# Patient Record
Sex: Female | Born: 1990 | Race: White | Hispanic: No | Marital: Single | Smoking: Never smoker
Health system: Southern US, Community
[De-identification: ages and names within clinical notes are randomized; demographics above are authoritative.]

## PROBLEM LIST (undated history)

## (undated) DIAGNOSIS — F419 Anxiety disorder, unspecified: Secondary | ICD-10-CM

## (undated) DIAGNOSIS — K7010 Alcoholic hepatitis without ascites: Secondary | ICD-10-CM

## (undated) DIAGNOSIS — I10 Essential (primary) hypertension: Secondary | ICD-10-CM

## (undated) DIAGNOSIS — F909 Attention-deficit hyperactivity disorder, unspecified type: Secondary | ICD-10-CM

## (undated) DIAGNOSIS — F1010 Alcohol abuse, uncomplicated: Secondary | ICD-10-CM

## (undated) DIAGNOSIS — F329 Major depressive disorder, single episode, unspecified: Secondary | ICD-10-CM

## (undated) DIAGNOSIS — F32A Depression, unspecified: Secondary | ICD-10-CM

---

## 2012-01-31 HISTORY — PX: WRIST SURGERY: SHX841

## 2012-01-31 HISTORY — PX: OTHER SURGICAL HISTORY: SHX169

## 2012-01-31 HISTORY — PX: KNEE CARTILAGE SURGERY: SHX688

## 2013-01-27 DIAGNOSIS — S5292XA Unspecified fracture of left forearm, initial encounter for closed fracture: Secondary | ICD-10-CM | POA: Insufficient documentation

## 2013-01-27 DIAGNOSIS — S52202A Unspecified fracture of shaft of left ulna, initial encounter for closed fracture: Secondary | ICD-10-CM | POA: Insufficient documentation

## 2013-01-27 DIAGNOSIS — F1010 Alcohol abuse, uncomplicated: Secondary | ICD-10-CM | POA: Insufficient documentation

## 2013-01-27 DIAGNOSIS — S129XXA Fracture of neck, unspecified, initial encounter: Secondary | ICD-10-CM | POA: Insufficient documentation

## 2013-01-27 DIAGNOSIS — S5291XA Unspecified fracture of right forearm, initial encounter for closed fracture: Secondary | ICD-10-CM | POA: Insufficient documentation

## 2013-01-27 DIAGNOSIS — S6292XA Unspecified fracture of left wrist and hand, initial encounter for closed fracture: Secondary | ICD-10-CM | POA: Insufficient documentation

## 2013-06-30 DIAGNOSIS — M25539 Pain in unspecified wrist: Secondary | ICD-10-CM | POA: Insufficient documentation

## 2013-10-03 DIAGNOSIS — S5291XA Unspecified fracture of right forearm, initial encounter for closed fracture: Secondary | ICD-10-CM | POA: Insufficient documentation

## 2013-12-16 DIAGNOSIS — S63016A Dislocation of distal radioulnar joint of unspecified wrist, initial encounter: Secondary | ICD-10-CM | POA: Insufficient documentation

## 2015-08-26 ENCOUNTER — Emergency Department
Admission: EM | Admit: 2015-08-26 | Discharge: 2015-08-26 | Disposition: A | Discharge status: Home / Self Care | Payer: Medicaid Other | Attending: Emergency Medicine | Admitting: Emergency Medicine

## 2015-08-26 ENCOUNTER — Encounter: Payer: Self-pay | Admitting: Urgent Care

## 2015-08-26 DIAGNOSIS — R799 Abnormal finding of blood chemistry, unspecified: Secondary | ICD-10-CM | POA: Diagnosis present

## 2015-08-26 DIAGNOSIS — F1099 Alcohol use, unspecified with unspecified alcohol-induced disorder: Secondary | ICD-10-CM | POA: Insufficient documentation

## 2015-08-26 DIAGNOSIS — L639 Alopecia areata, unspecified: Secondary | ICD-10-CM | POA: Diagnosis not present

## 2015-08-26 DIAGNOSIS — F909 Attention-deficit hyperactivity disorder, unspecified type: Secondary | ICD-10-CM | POA: Insufficient documentation

## 2015-08-26 DIAGNOSIS — L659 Nonscarring hair loss, unspecified: Secondary | ICD-10-CM

## 2015-08-26 HISTORY — DX: Depression, unspecified: F32.A

## 2015-08-26 HISTORY — DX: Anxiety disorder, unspecified: F41.9

## 2015-08-26 HISTORY — DX: Major depressive disorder, single episode, unspecified: F32.9

## 2015-08-26 HISTORY — DX: Attention-deficit hyperactivity disorder, unspecified type: F90.9

## 2015-08-26 LAB — BASIC METABOLIC PANEL WITH GFR
Anion gap: 10 (ref 5–15)
BUN: 18 mg/dL (ref 6–20)
CO2: 27 mmol/L (ref 22–32)
Calcium: 9.8 mg/dL (ref 8.9–10.3)
Chloride: 100 mmol/L — ABNORMAL LOW (ref 101–111)
Creatinine, Ser: 0.84 mg/dL (ref 0.44–1.00)
GFR calc Af Amer: >60 mL/min
GFR calc non Af Amer: >60 mL/min
Glucose, Bld: 91 mg/dL (ref 65–99)
Potassium: 3.5 mmol/L (ref 3.5–5.1)
Sodium: 137 mmol/L (ref 135–145)

## 2015-08-26 LAB — CBC
HEMATOCRIT: 41.7 % (ref 35.0–47.0)
HEMOGLOBIN: 14.3 g/dL (ref 12.0–16.0)
MCH: 30.6 pg (ref 26.0–34.0)
MCHC: 34.1 g/dL (ref 32.0–36.0)
MCV: 89.6 fL (ref 80.0–100.0)
Platelets: 295 10*3/uL (ref 150–440)
RBC: 4.66 MIL/uL (ref 3.80–5.20)
RDW: 12.6 % (ref 11.5–14.5)
WBC: 7.9 10*3/uL (ref 3.6–11.0)

## 2015-08-26 LAB — URINALYSIS COMPLETE WITH MICROSCOPIC (ARMC ONLY)
Bacteria, UA: NONE SEEN
Bilirubin Urine: NEGATIVE
Glucose, UA: NEGATIVE mg/dL
Hgb urine dipstick: NEGATIVE
Ketones, ur: NEGATIVE mg/dL
Leukocytes, UA: NEGATIVE
Nitrite: NEGATIVE
Protein, ur: NEGATIVE mg/dL
Specific Gravity, Urine: 1.023 (ref 1.005–1.030)
pH: 5 (ref 5.0–8.0)

## 2015-08-26 LAB — PREGNANCY, URINE: PREG TEST UR: NEGATIVE

## 2015-08-26 NOTE — ED Notes (Signed)
Patient presents to the ED from home with c/o her PCP calling her and telling her to come to the ED because her K+ was high does not know the results. Patient initially presented to PCP with new alopecia capitus going on for weeks seen yesterday. No c/o. No distress noted. Alert andOrient x4. Breathing even and unlabored.

## 2015-08-26 NOTE — ED Triage Notes (Signed)
Patient presents to the ED from home with c/o her PCP calling her and telling her to come to the ED secondary to HYPERkalemia; does not know the results. Patient initially presented to PCP in [facility] secondary to some new alopecia capitus; Thyroid panel checked and patient was advised that it was fine.

## 2015-08-26 NOTE — Discharge Instructions (Signed)
At this time, there is nothing to indicate that potassium is high or dangerous. Return to primary care doctor & consider following up with dermatology for your alopecia concerns.

## 2015-08-26 NOTE — ED Notes (Signed)
MD at bedside. 

## 2015-08-26 NOTE — ED Provider Notes (Addendum)
[facility] Emergency Department Provider Note  ____________________________________________   I have reviewed the triage vital signs and the nursing notes.   HISTORY  Chief Complaint Abnormal Lab    HPI Gina Price is a 25 y.o. female presents today complaining of nothing. She states she feels just fine. However blood work was drawn because she is having some issues with alopecia and it was reported that she had an elevated potassium she told that she would die if she does not come immediately to the emergency room patient has no component and the reason that height potassium.     Past Medical History:  Diagnosis Date  . ADHD (attention deficit hyperactivity disorder)   . Anxiety   . Depression     There are no active problems to display for this patient.   No past surgical history on file.    Allergies Penicillins  No family history on file.  Social History Social History  Substance Use Topics  . Smoking status: Never Smoker  . Smokeless tobacco: Never Used  . Alcohol use Yes    Review of Systems See history of present illness otherwise negative  ____________________________________________   PHYSICAL EXAM:  VITAL SIGNS: ED Triage Vitals  Enc Vitals Group     BP 08/26/15 1944 132/81     Pulse Rate 08/26/15 1944 83     Resp 08/26/15 1944 18     Temp 08/26/15 1944 98.4 F (36.9 C)     Temp Source 08/26/15 1944 Oral     SpO2 08/26/15 1944 100 %     Weight 08/26/15 1944 130 lb (59 kg)     Height 08/26/15 1944 5\' 1"  (1.549 m)     Head Circumference --      Peak Flow --      Pain Score 08/26/15 1948 0     Pain Loc --      Pain Edu? --      Excl. in [place]? --     Constitutional: Alert and oriented. Well appearing and in no acute distress. Head: There are a few patches of missing hair with no evidence of fungal infection. Cardiovascular: Normal rate, regular rhythm. Grossly normal heart sounds.  Good peripheral  circulation. Respiratory: Normal respiratory effort.  No retractions. Lungs CTAB. Abdominal: Soft and nontender. No distention. No guarding no rebound Back:  There is no focal tenderness or step off.  there is no midline tenderness there are no lesions noted. there is no CVA tenderness  Musculoskeletal: No lower extremity tenderness, no upper extremity tenderness. No joint effusions, no DVT signs strong distal pulses no edema Neurologic:  Normal speech and language. No gross focal neurologic deficits are appreciated.  Skin:  Skin is warm, dry and intact. No rash noted. Psychiatric: Mood and affect are normal. Speech and behavior are normal.  ____________________________________________   LABS (all labs ordered are listed, but only abnormal results are displayed)  Labs Reviewed  BASIC METABOLIC PANEL - Abnormal; Notable for the following:       Result Value   Chloride 100 (*)    All other components within normal limits  URINALYSIS COMPLETEWITH MICROSCOPIC ([facility] ONLY) - Abnormal; Notable for the following:    Color, Urine YELLOW (*)    APPearance HAZY (*)    Squamous Epithelial / LPF 0-5 (*)    All other components within normal limits  CBC  PREGNANCY, URINE   ____________________________________________  EKG  I personally interpreted any EKGs ordered by me or  triage  ____________________________________________  RADIOLOGY  I reviewed any imaging ordered by me or triage that were performed during my shift and, if possible, patient and/or family made aware of any abnormal findings. ____________________________________________   PROCEDURES  Procedure(s) performed: None  Procedures  Critical Care performed: None  ____________________________________________   INITIAL IMPRESSION / ASSESSMENT AND PLAN / ED COURSE  Pertinent labs & imaging results that were available during my care of the patient were reviewed by me and considered in my medical decision making (see  chart for details).  Patient sent here for potassium that was elevated. It was likely hemolyzed. Her potassium was normal there is no indication that she is at risk for hyperkalemia she has normal renal function and no evidence of compartment syndrome or muscle breakdown etc. She is reassured and will follow closely with her primary care   Clinical Course   ____________________________________________   FINAL CLINICAL IMPRESSION(S) / ED DIAGNOSES  Final diagnoses:  None      This chart was dictated using voice recognition software.  Despite best efforts to proofread,  errors can occur which can change meaning.      Jeanmarie Plant, MD 08/26/15 2112    Jeanmarie Plant, MD 08/26/15 2114

## 2018-10-30 ENCOUNTER — Ambulatory Visit (INDEPENDENT_AMBULATORY_CARE_PROVIDER_SITE_OTHER): Payer: Medicaid Other | Admitting: Obstetrics and Gynecology

## 2018-10-30 ENCOUNTER — Other Ambulatory Visit (HOSPITAL_COMMUNITY)
Admission: RE | Admit: 2018-10-30 | Discharge: 2018-10-30 | Disposition: A | Discharge status: Home / Self Care | Payer: Medicaid Other | Source: Ambulatory Visit | Attending: Obstetrics and Gynecology | Admitting: Obstetrics and Gynecology

## 2018-10-30 ENCOUNTER — Other Ambulatory Visit: Payer: Self-pay

## 2018-10-30 ENCOUNTER — Encounter: Payer: Self-pay | Admitting: Obstetrics and Gynecology

## 2018-10-30 VITALS — BP 125/73 | HR 63 | Ht 61.0 in | Wt 156.0 lb

## 2018-10-30 DIAGNOSIS — Z113 Encounter for screening for infections with a predominantly sexual mode of transmission: Secondary | ICD-10-CM | POA: Insufficient documentation

## 2018-10-30 DIAGNOSIS — Z1239 Encounter for other screening for malignant neoplasm of breast: Secondary | ICD-10-CM

## 2018-10-30 DIAGNOSIS — Z01419 Encounter for gynecological examination (general) (routine) without abnormal findings: Secondary | ICD-10-CM

## 2018-10-30 DIAGNOSIS — B373 Candidiasis of vulva and vagina: Secondary | ICD-10-CM

## 2018-10-30 DIAGNOSIS — B3731 Acute candidiasis of vulva and vagina: Secondary | ICD-10-CM

## 2018-10-30 DIAGNOSIS — Z124 Encounter for screening for malignant neoplasm of cervix: Secondary | ICD-10-CM | POA: Insufficient documentation

## 2018-10-30 DIAGNOSIS — Z0000 Encounter for general adult medical examination without abnormal findings: Secondary | ICD-10-CM

## 2018-10-30 MED ORDER — FLUCONAZOLE 150 MG PO TABS: 150.0000 mg | ORAL_TABLET | Freq: Once | ORAL | 0 refills | Status: AC
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
## 2018-10-31 LAB — HEP, RPR, HIV PANEL
HIV Screen 4th Generation wRfx: NONREACTIVE
Hepatitis B Surface Ag: NEGATIVE
RPR Ser Ql: NONREACTIVE

## 2018-11-04 ENCOUNTER — Telehealth: Payer: Self-pay | Admitting: Obstetrics and Gynecology

## 2018-11-04 NOTE — Telephone Encounter (Signed)
Patient is calling for labs results. Please advise. 

## 2018-11-06 NOTE — Telephone Encounter (Signed)
Left voicemail labs normal but pap not back yet

## 2018-11-11 ENCOUNTER — Telehealth: Payer: Self-pay | Admitting: Obstetrics and Gynecology

## 2018-11-11 NOTE — Telephone Encounter (Signed)
Patient is calling stating she was seen on 10/30/18 for a pap smear and still hasn't received results. Please advise

## 2018-11-12 ENCOUNTER — Telehealth: Payer: Self-pay | Admitting: Obstetrics and Gynecology

## 2018-11-12 LAB — CYTOLOGY - PAP
Chlamydia: NEGATIVE
Diagnosis: NEGATIVE
High risk HPV: NEGATIVE
Neisseria Gonorrhea: NEGATIVE
Trichomonas: NEGATIVE

## 2018-11-12 NOTE — Telephone Encounter (Signed)
Tried calling patient pap result is back and normal

## 2018-11-12 NOTE — Telephone Encounter (Signed)
Patient returned AMS call, aware results of pap normal.  She does state she is still having yeast infection symptoms after Rx and asking about something else being sent in.

## 2018-11-12 NOTE — Telephone Encounter (Signed)
Please advise. Thank you

## 2019-04-17 ENCOUNTER — Telehealth: Payer: Self-pay

## 2019-04-17 NOTE — Telephone Encounter (Signed)
Pt needs a diflucan. Pt aware AMS is not in the office, she states she will call back Monday. Pt aware she can try monistat for relief

## 2019-05-01 ENCOUNTER — Emergency Department: Payer: Medicaid Other

## 2019-05-01 ENCOUNTER — Encounter: Payer: Self-pay | Admitting: *Deleted

## 2019-05-01 ENCOUNTER — Emergency Department
Admission: EM | Admit: 2019-05-01 | Discharge: 2019-05-01 | Disposition: A | Discharge status: Home / Self Care | Payer: Medicaid Other | Attending: Emergency Medicine | Admitting: Emergency Medicine

## 2019-05-01 ENCOUNTER — Other Ambulatory Visit: Payer: Self-pay

## 2019-05-01 DIAGNOSIS — H538 Other visual disturbances: Secondary | ICD-10-CM | POA: Insufficient documentation

## 2019-05-01 DIAGNOSIS — H9313 Tinnitus, bilateral: Secondary | ICD-10-CM | POA: Diagnosis not present

## 2019-05-01 DIAGNOSIS — R519 Headache, unspecified: Secondary | ICD-10-CM | POA: Diagnosis present

## 2019-05-01 DIAGNOSIS — R202 Paresthesia of skin: Secondary | ICD-10-CM | POA: Diagnosis not present

## 2019-05-01 DIAGNOSIS — G43909 Migraine, unspecified, not intractable, without status migrainosus: Secondary | ICD-10-CM

## 2019-05-01 DIAGNOSIS — R531 Weakness: Secondary | ICD-10-CM | POA: Diagnosis not present

## 2019-05-01 LAB — COMPREHENSIVE METABOLIC PANEL
ALT: 24 U/L (ref 0–44)
AST: 22 U/L (ref 15–41)
Albumin: 4.4 g/dL (ref 3.5–5.0)
Alkaline Phosphatase: 61 U/L (ref 38–126)
Anion gap: 8 (ref 5–15)
BUN: 22 mg/dL — ABNORMAL HIGH (ref 6–20)
CO2: 26 mmol/L (ref 22–32)
Calcium: 9.3 mg/dL (ref 8.9–10.3)
Chloride: 102 mmol/L (ref 98–111)
Creatinine, Ser: 0.82 mg/dL (ref 0.44–1.00)
GFR calc Af Amer: >60 mL/min (ref >60)
GFR calc non Af Amer: >60 mL/min (ref >60)
Glucose, Bld: 89 mg/dL (ref 70–99)
Potassium: 4.2 mmol/L (ref 3.5–5.1)
Sodium: 136 mmol/L (ref 135–145)
Total Bilirubin: 0.9 mg/dL (ref 0.3–1.2)
Total Protein: 7.2 g/dL (ref 6.5–8.1)

## 2019-05-01 LAB — CBC
HCT: 38.5 % (ref 36.0–46.0)
Hemoglobin: 13 g/dL (ref 12.0–15.0)
MCH: 29.6 pg (ref 26.0–34.0)
MCHC: 33.8 g/dL (ref 30.0–36.0)
MCV: 87.7 fL (ref 80.0–100.0)
Platelets: 340 10*3/uL (ref 150–400)
RBC: 4.39 MIL/uL (ref 3.87–5.11)
RDW: 11.9 % (ref 11.5–15.5)
WBC: 9.8 10*3/uL (ref 4.0–10.5)
nRBC: 0 % (ref 0.0–0.2)

## 2019-05-01 MED ORDER — BUTALBITAL-APAP-CAFFEINE 50-325-40 MG PO TABS: 1.0000 | ORAL_TABLET | Freq: Four times a day (QID) | ORAL | 0 refills | Status: DC | PRN

## 2019-05-01 MED ORDER — BUTALBITAL-APAP-CAFFEINE 50-325-40 MG PO TABS
2.0000 | ORAL_TABLET | Freq: Once | ORAL | Status: AC
  Administered 2019-05-01: 2 via ORAL
  Filled 2019-05-01: qty 2

## 2019-05-01 NOTE — Discharge Instructions (Signed)
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  Please call the number provided for ophthalmology to arrange a follow-up appointment for further evaluation.  You may also call the number provided for neurology to arrange for further evaluation as well.  Return to the emergency department for any symptom personally concerning to yourself.

## 2019-05-01 NOTE — ED Triage Notes (Signed)
Pt brought in via ems from work  Pt has headaches since July 2020.  Pt took motrin today without relief.  Pt had visual change earlier, but normal now.  Pt alert  Speech clear.
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
## 2019-07-30 ENCOUNTER — Other Ambulatory Visit: Payer: Self-pay | Admitting: Neurology

## 2019-07-30 DIAGNOSIS — R519 Headache, unspecified: Secondary | ICD-10-CM

## 2019-08-13 ENCOUNTER — Other Ambulatory Visit: Payer: Self-pay

## 2019-08-13 ENCOUNTER — Ambulatory Visit
Admission: RE | Admit: 2019-08-13 | Discharge: 2019-08-13 | Disposition: A | Discharge status: Home / Self Care | Payer: Medicaid Other | Source: Ambulatory Visit | Attending: Neurology | Admitting: Neurology

## 2019-08-13 DIAGNOSIS — R519 Headache, unspecified: Secondary | ICD-10-CM | POA: Diagnosis present

## 2019-10-09 ENCOUNTER — Emergency Department
Admission: EM | Admit: 2019-10-09 | Discharge: 2019-10-09 | Disposition: A | Discharge status: Home / Self Care | Payer: Medicaid Other | Attending: Emergency Medicine | Admitting: Emergency Medicine

## 2019-10-09 ENCOUNTER — Emergency Department: Payer: Medicaid Other

## 2019-10-09 ENCOUNTER — Other Ambulatory Visit: Payer: Self-pay

## 2019-10-09 DIAGNOSIS — Z20822 Contact with and (suspected) exposure to covid-19: Secondary | ICD-10-CM | POA: Diagnosis not present

## 2019-10-09 DIAGNOSIS — F909 Attention-deficit hyperactivity disorder, unspecified type: Secondary | ICD-10-CM | POA: Diagnosis not present

## 2019-10-09 DIAGNOSIS — Z79899 Other long term (current) drug therapy: Secondary | ICD-10-CM | POA: Diagnosis not present

## 2019-10-09 DIAGNOSIS — B349 Viral infection, unspecified: Secondary | ICD-10-CM | POA: Diagnosis not present

## 2019-10-09 DIAGNOSIS — J069 Acute upper respiratory infection, unspecified: Secondary | ICD-10-CM | POA: Diagnosis present

## 2019-10-09 LAB — SARS CORONAVIRUS 2 BY RT PCR (HOSPITAL ORDER, PERFORMED IN ~~LOC~~ HOSPITAL LAB): SARS Coronavirus 2: NEGATIVE

## 2019-10-09 MED ORDER — PREDNISONE 50 MG PO TABS: 50.0000 mg | ORAL_TABLET | Freq: Every day | ORAL | 0 refills | Status: DC

## 2019-10-09 MED ORDER — ALBUTEROL SULFATE HFA 108 (90 BASE) MCG/ACT IN AERS: 2.0000 | INHALATION_SPRAY | RESPIRATORY_TRACT | 0 refills | Status: DC | PRN
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
## 2019-10-09 NOTE — ED Notes (Signed)
Patient declined discharge vital signs. 

## 2019-10-09 NOTE — ED Triage Notes (Signed)
Pt c/o cough with congestion, sore throat, SOB since last night, states she was recently exposed to covid and had test performed PTA today at [facility]

## 2019-10-13 ENCOUNTER — Ambulatory Visit
Admission: EM | Admit: 2019-10-13 | Discharge: 2019-10-13 | Disposition: A | Discharge status: Home / Self Care | Payer: Medicaid Other | Attending: Physician Assistant | Admitting: Physician Assistant

## 2019-10-13 ENCOUNTER — Other Ambulatory Visit: Payer: Self-pay

## 2019-10-13 ENCOUNTER — Encounter: Payer: Self-pay | Admitting: Emergency Medicine

## 2019-10-13 DIAGNOSIS — R05 Cough: Secondary | ICD-10-CM

## 2019-10-13 DIAGNOSIS — J209 Acute bronchitis, unspecified: Secondary | ICD-10-CM

## 2019-10-13 DIAGNOSIS — R0602 Shortness of breath: Secondary | ICD-10-CM | POA: Diagnosis not present

## 2019-10-13 DIAGNOSIS — R059 Cough, unspecified: Secondary | ICD-10-CM

## 2019-10-13 MED ORDER — CHERATUSSIN AC 100-10 MG/5ML PO SOLN: 5.0000 mL | Freq: Four times a day (QID) | ORAL | 0 refills | Status: AC | PRN
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
## 2019-10-13 NOTE — ED Triage Notes (Signed)
Patient in today c/o cough and sob x 5 days. Patient seen at [facility] PTA on 10/09/19 and had covid PCR test and it was negative. Patient seen later at [facility] ED and had a rapid covid test that was also negative. Patient was prescribed Prednisone 50mg  and Albuterol inhaler at the ED. Patient did get a CXR at the ED and it was normal.  Patient has had 1st covid vaccine on 09/12/19. Patient states she has felt feverish, but didn't take her temperature.

## 2019-10-13 NOTE — Discharge Instructions (Signed)
-  Continue inhaler. Take prescription cough medication and increase rest and fluids. If breathing worsens, go to ED

## 2020-03-02 ENCOUNTER — Ambulatory Visit: Payer: Medicaid Other | Admitting: Advanced Practice Midwife

## 2020-03-02 ENCOUNTER — Other Ambulatory Visit: Payer: Self-pay

## 2020-03-02 ENCOUNTER — Ambulatory Visit (INDEPENDENT_AMBULATORY_CARE_PROVIDER_SITE_OTHER): Payer: Medicaid Other | Admitting: Advanced Practice Midwife

## 2020-03-02 ENCOUNTER — Other Ambulatory Visit (HOSPITAL_COMMUNITY)
Admission: RE | Admit: 2020-03-02 | Discharge: 2020-03-02 | Disposition: A | Discharge status: Home / Self Care | Payer: Medicaid Other | Source: Ambulatory Visit | Attending: Advanced Practice Midwife | Admitting: Advanced Practice Midwife

## 2020-03-02 ENCOUNTER — Encounter: Payer: Self-pay | Admitting: Advanced Practice Midwife

## 2020-03-02 VITALS — BP 130/90 | Ht 61.0 in | Wt 137.0 lb

## 2020-03-02 DIAGNOSIS — N898 Other specified noninflammatory disorders of vagina: Secondary | ICD-10-CM | POA: Diagnosis not present

## 2020-03-02 MED ORDER — METRONIDAZOLE 500 MG PO TABS: 500.0000 mg | ORAL_TABLET | Freq: Two times a day (BID) | ORAL | 0 refills | Status: AC

## 2020-03-02 MED ORDER — FLUCONAZOLE 150 MG PO TABS: 150.0000 mg | ORAL_TABLET | Freq: Once | ORAL | 3 refills | Status: AC

## 2020-03-02 NOTE — Patient Instructions (Signed)
Vaginitis  Vaginitis is a condition in which the vaginal tissue swells and becomes irritated. This condition is most often caused by a change in the normal balance of bacteria and yeast that live in the vagina. This change causes an overgrowth of certain bacteria or yeast, which causes the inflammation. There are different types of vaginitis. What are the causes? The cause of this condition depends on the type of vaginitis. It can be caused by:  Bacteria (bacterial vaginosis).  Yeast, which is a fungus (candidiasis).  A parasite (trichomoniasis vaginitis).  A virus (viral vaginitis).  Low hormone levels (atrophic vaginitis). Low hormone levels can occur during pregnancy, breastfeeding, or after menopause.  Irritants, such as bubble baths, scented tampons, and feminine sprays (allergic vaginitis). Other factors can change the normal balance of the yeast and bacteria that live in the vagina. These include:  Antibiotic medicines.  Poor hygiene.  Diaphragms, vaginal sponges, spermicides, birth control pills, and intrauterine devices (IUDs).  Sex.  Infection.  Uncontrolled diabetes.  A weakened body defense system (immune system). What increases the risk? This condition is more likely to develop in women who:  Smoke or are exposed to secondhand smoke.  Use vaginal douches, scented tampons, or scented sanitary pads.  Wear tight-fitting pants or thong underwear.  Use oral birth control pills or an IUD.  Have sex without a condom or have multiple partners.  Have an STI.  Frequently use the spermicide nonoxynol-9.  Eat lots of foods high in sugar or who have uncontrolled diabetes.  Have low estrogen levels.  Have a weakened immune system from an immune disorder or medical treatment.  Are pregnant or breastfeeding. What are the signs or symptoms? Symptoms vary depending on the cause of the vaginitis. Common symptoms include:  Abnormal vaginal discharge. ? The  discharge is white, gray, or yellow with bacterial vaginosis. ? The discharge is thick, white, and cheesy with a yeast infection. ? The discharge is frothy and yellow or greenish with trichomoniasis.  A bad vaginal smell. The smell is fishy with bacterial vaginosis.  Vaginal itching, pain, or swelling.  Pain with sex.  Pain or burning when urinating. Sometimes there are no symptoms. How is this diagnosed? This condition is diagnosed based on your symptoms and medical history. A physical exam, including a pelvic exam, will also be done. You may also have other tests, including:  Tests to determine the pH level (acidity or alkalinity) of your vagina.  A whiff test to assess the odor that results when a sample of your vaginal discharge is mixed with a potassium hydroxide solution.  Tests of vaginal fluid. A sample will be examined under a microscope. How is this treated? Treatment varies depending on the type of vaginitis you have. Your treatment may include:  Antibiotic creams or pills to treat bacterial vaginosis and trichomoniasis.  Antifungal medicines, such as vaginal creams or suppositories, to treat a yeast infection.  Medicine to ease discomfort if you have viral vaginitis. Your sexual partner should also be treated.  Estrogen delivered in a cream, pill, suppository, or vaginal ring to treat atrophic vaginitis. If vaginal dryness occurs, lubricants and moisturizing creams may help. You may need to avoid scented soaps, sprays, or douches.  Stopping use of a product that is causing allergic vaginitis and then using a vaginal cream to treat the symptoms. Follow these instructions at home: Lifestyle  Keep your genital area clean and dry. Avoid soap, and only rinse the area with water.  Do not douche   or use tampons until your health care provider says it is okay. Use sanitary pads, if needed.  Do not have sex until your health care provider approves. When you can return to sex,  practice safe sex and use condoms.  Wipe from front to back. This avoids the spread of bacteria from the rectum to the vagina. General instructions  Take over-the-counter and prescription medicines only as told by your health care provider.  If you were prescribed an antibiotic medicine, take or use it as told by your health care provider. Do not stop taking or using the antibiotic even if you start to feel better.  Keep all follow-up visits. This is important. How is this prevented?  Use mild, unscented products. Do not use things that can irritate the vagina, such as fabric softeners. Avoid the following products if they are scented: ? Feminine sprays. ? Detergents. ? Tampons. ? Feminine hygiene products. ? Soaps or bubble baths.  Let air reach your genital area. To do this: ? Wear cotton underwear to reduce moisture buildup. ? Avoid wearing underwear while you sleep. ? Avoid wearing tight pants and underwear or nylons without a cotton panel. ? Avoid wearing thong underwear.  Take off any wet clothing, such as bathing suits, as soon as possible.  Practice safe sex and use condoms. Contact a health care provider if:  You have abdominal or pelvic pain.  You have a fever or chills.  You have symptoms that last for more than 2-3 days. Get help right away if:  You have a fever and your symptoms suddenly get worse. Summary  Vaginitis is a condition in which the vaginal tissue becomes inflamed.This condition is most often caused by a change in the normal balance of bacteria and yeast that live in the vagina.  Treatment varies depending on the type of vaginitis you have.  Do not douche, use tampons, or have sex until your health care provider approves. When you can return to sex, practice safe sex and use condoms. This information is not intended to replace advice given to you by your health care provider. Make sure you discuss any questions you have with your health care  provider. Document Revised: 07/17/2019 Document Reviewed: 07/17/2019 Elsevier Patient Education  2021 [employer]  

## 2020-03-03 LAB — CERVICOVAGINAL ANCILLARY ONLY
Bacterial Vaginitis (gardnerella): POSITIVE — AB
Candida Glabrata: NEGATIVE
Candida Vaginitis: POSITIVE — AB
Chlamydia: NEGATIVE
Comment: NEGATIVE
Comment: NEGATIVE
Comment: NEGATIVE
Comment: NEGATIVE
Comment: NEGATIVE
Comment: NORMAL
Neisseria Gonorrhea: NEGATIVE
Trichomonas: NEGATIVE
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
## 2020-04-14 ENCOUNTER — Ambulatory Visit (INDEPENDENT_AMBULATORY_CARE_PROVIDER_SITE_OTHER): Payer: Medicaid Other | Admitting: Obstetrics

## 2020-04-14 ENCOUNTER — Encounter: Payer: Self-pay | Admitting: Obstetrics

## 2020-04-14 ENCOUNTER — Other Ambulatory Visit: Payer: Self-pay

## 2020-04-14 ENCOUNTER — Ambulatory Visit: Payer: Medicaid Other | Admitting: Obstetrics and Gynecology

## 2020-04-14 DIAGNOSIS — Z30432 Encounter for removal of intrauterine contraceptive device: Secondary | ICD-10-CM | POA: Diagnosis not present
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
## 2020-05-20 ENCOUNTER — Ambulatory Visit (INDEPENDENT_AMBULATORY_CARE_PROVIDER_SITE_OTHER): Payer: Medicaid Other | Admitting: Obstetrics and Gynecology

## 2020-05-20 ENCOUNTER — Other Ambulatory Visit: Payer: Self-pay

## 2020-05-20 ENCOUNTER — Encounter: Payer: Self-pay | Admitting: Obstetrics and Gynecology

## 2020-05-20 VITALS — BP 133/89 | Ht 60.0 in | Wt 143.0 lb

## 2020-05-20 DIAGNOSIS — N926 Irregular menstruation, unspecified: Secondary | ICD-10-CM | POA: Diagnosis not present

## 2020-05-20 DIAGNOSIS — O039 Complete or unspecified spontaneous abortion without complication: Secondary | ICD-10-CM

## 2020-05-20 LAB — POCT URINE PREGNANCY: Preg Test, Ur: NEGATIVE
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
## 2020-05-25 ENCOUNTER — Encounter: Payer: Self-pay | Admitting: Obstetrics and Gynecology

## 2020-10-10 ENCOUNTER — Emergency Department: Payer: Medicaid Other

## 2020-10-10 ENCOUNTER — Other Ambulatory Visit: Payer: Self-pay

## 2020-10-10 DIAGNOSIS — R55 Syncope and collapse: Secondary | ICD-10-CM | POA: Insufficient documentation

## 2020-10-10 DIAGNOSIS — F410 Panic disorder [episodic paroxysmal anxiety] without agoraphobia: Secondary | ICD-10-CM | POA: Insufficient documentation

## 2020-10-10 DIAGNOSIS — R0602 Shortness of breath: Secondary | ICD-10-CM | POA: Diagnosis not present

## 2020-10-10 DIAGNOSIS — R569 Unspecified convulsions: Secondary | ICD-10-CM | POA: Diagnosis not present

## 2020-10-10 DIAGNOSIS — Z5321 Procedure and treatment not carried out due to patient leaving prior to being seen by health care provider: Secondary | ICD-10-CM | POA: Insufficient documentation

## 2020-10-10 DIAGNOSIS — R1011 Right upper quadrant pain: Secondary | ICD-10-CM | POA: Insufficient documentation

## 2020-10-10 LAB — CBC
HCT: 42.8 % (ref 36.0–46.0)
Hemoglobin: 15.1 g/dL — ABNORMAL HIGH (ref 12.0–15.0)
MCH: 33.1 pg (ref 26.0–34.0)
MCHC: 35.3 g/dL (ref 30.0–36.0)
MCV: 93.9 fL (ref 80.0–100.0)
Platelets: 292 10*3/uL (ref 150–400)
RBC: 4.56 MIL/uL (ref 3.87–5.11)
RDW: 13.1 % (ref 11.5–15.5)
WBC: 8.2 10*3/uL (ref 4.0–10.5)
nRBC: 0 % (ref 0.0–0.2)

## 2020-10-10 LAB — BASIC METABOLIC PANEL
Anion gap: 9 (ref 5–15)
BUN: 13 mg/dL (ref 6–20)
CO2: 24 mmol/L (ref 22–32)
Calcium: 9.1 mg/dL (ref 8.9–10.3)
Chloride: 106 mmol/L (ref 98–111)
Creatinine, Ser: 0.67 mg/dL (ref 0.44–1.00)
GFR, Estimated: >60 mL/min (ref >60)
Glucose, Bld: 95 mg/dL (ref 70–99)
Potassium: 3.8 mmol/L (ref 3.5–5.1)
Sodium: 139 mmol/L (ref 135–145)

## 2020-10-10 LAB — TROPONIN I (HIGH SENSITIVITY): Troponin I (High Sensitivity): 17 ng/L (ref <18)

## 2020-10-10 NOTE — ED Triage Notes (Signed)
Pt BIB roommate, very anxious, rapid breathing, inability to speak x13min. Able to coach with deep breathing and complete triage after. Pt states that she went to PMD June and was told 'i am not getting enough oxygen from my red blood cells and it causes me to go into a panic attack. I had a seizure on Saturday where my eyes rolled back and my hands clenched. I was doing homework earlier tonight and I just passed out. I have been intermittently coughing/vomiting x1-2 months. My shortness of breath and R upper stomach pain has been going on x2days.'

## 2020-10-11 ENCOUNTER — Emergency Department
Admission: EM | Admit: 2020-10-11 | Discharge: 2020-10-11 | Disposition: A | Discharge status: Home / Self Care | Payer: Medicaid Other | Attending: Emergency Medicine | Admitting: Emergency Medicine

## 2020-10-11 LAB — TROPONIN I (HIGH SENSITIVITY): Troponin I (High Sensitivity): 6 ng/L (ref <18)

## 2020-10-11 LAB — POC URINE PREG, ED: Preg Test, Ur: NEGATIVE

## 2020-10-11 NOTE — ED Notes (Signed)
Pt inquiring about wait, says her only ride is here and cannot wait. Reinforced we would like her to stay. Pt back up to desk stating she is leaving with her room mate.

## 2020-10-21 ENCOUNTER — Encounter: Payer: Self-pay | Admitting: Emergency Medicine

## 2020-10-21 ENCOUNTER — Emergency Department: Payer: Medicaid Other

## 2020-10-21 ENCOUNTER — Emergency Department
Admission: EM | Admit: 2020-10-21 | Discharge: 2020-10-21 | Disposition: A | Discharge status: Home / Self Care | Payer: Medicaid Other | Attending: Emergency Medicine | Admitting: Emergency Medicine

## 2020-10-21 ENCOUNTER — Other Ambulatory Visit: Payer: Self-pay

## 2020-10-21 DIAGNOSIS — M79632 Pain in left forearm: Secondary | ICD-10-CM | POA: Diagnosis not present

## 2020-10-21 NOTE — ED Triage Notes (Signed)
Patient ambulatory to triage with steady gait, without difficulty or distress noted; pt reports hx left FA fx surg several yrs ago; c/o pain to left FA x mo with no specific injury; st "uses arm a lot at work and felt a pop in it recently"; st hx of "screws becoming misplaced"
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
## 2020-10-21 NOTE — Discharge Instructions (Addendum)
You can use the sling for comfort, but do not keep your arm in it all the time, it can lead to "frozen shoulder" a painful condition of the shoulder with decreased mobility. Please be sure to range your shoulder frequently. Please seek medical attention for any high fevers, chest pain, shortness of breath, change in behavior, persistent vomiting, bloody stool or any other new or concerning symptoms.

## 2020-11-18 ENCOUNTER — Ambulatory Visit
Admission: EM | Admit: 2020-11-18 | Discharge: 2020-11-18 | Disposition: A | Discharge status: Home / Self Care | Payer: Medicaid Other | Attending: Physician Assistant | Admitting: Physician Assistant

## 2020-11-18 ENCOUNTER — Other Ambulatory Visit: Payer: Self-pay

## 2020-11-18 DIAGNOSIS — R197 Diarrhea, unspecified: Secondary | ICD-10-CM | POA: Insufficient documentation

## 2020-11-18 DIAGNOSIS — R109 Unspecified abdominal pain: Secondary | ICD-10-CM | POA: Insufficient documentation

## 2020-11-18 DIAGNOSIS — K529 Noninfective gastroenteritis and colitis, unspecified: Secondary | ICD-10-CM | POA: Diagnosis not present

## 2020-11-18 LAB — PREGNANCY, URINE: Preg Test, Ur: NEGATIVE

## 2020-11-18 LAB — CBC WITH DIFFERENTIAL/PLATELET
Abs Immature Granulocytes: 0.02 10*3/uL (ref 0.00–0.07)
Basophils Absolute: 0 10*3/uL (ref 0.0–0.1)
Basophils Relative: 1 %
Eosinophils Absolute: 0.1 10*3/uL (ref 0.0–0.5)
Eosinophils Relative: 1 %
HCT: 40 % (ref 36.0–46.0)
Hemoglobin: 13.2 g/dL (ref 12.0–15.0)
Immature Granulocytes: 0 %
Lymphocytes Relative: 26 %
Lymphs Abs: 1.5 10*3/uL (ref 0.7–4.0)
MCH: 32.6 pg (ref 26.0–34.0)
MCHC: 33 g/dL (ref 30.0–36.0)
MCV: 98.8 fL (ref 80.0–100.0)
Monocytes Absolute: 0.5 10*3/uL (ref 0.1–1.0)
Monocytes Relative: 8 %
Neutro Abs: 3.7 10*3/uL (ref 1.7–7.7)
Neutrophils Relative %: 64 %
Platelets: 190 10*3/uL (ref 150–400)
RBC: 4.05 MIL/uL (ref 3.87–5.11)
RDW: 13.1 % (ref 11.5–15.5)
WBC: 5.7 10*3/uL (ref 4.0–10.5)
nRBC: 0 % (ref 0.0–0.2)

## 2020-11-18 LAB — URINALYSIS, COMPLETE (UACMP) WITH MICROSCOPIC
Glucose, UA: NEGATIVE mg/dL
Hgb urine dipstick: NEGATIVE
Leukocytes,Ua: NEGATIVE
Nitrite: NEGATIVE
Protein, ur: NEGATIVE mg/dL
Specific Gravity, Urine: 1.025 (ref 1.005–1.030)
pH: 6 (ref 5.0–8.0)

## 2020-11-18 LAB — COMPREHENSIVE METABOLIC PANEL
ALT: 31 U/L (ref 0–44)
AST: 35 U/L (ref 15–41)
Albumin: 4 g/dL (ref 3.5–5.0)
Alkaline Phosphatase: 76 U/L (ref 38–126)
Anion gap: 7 (ref 5–15)
BUN: 16 mg/dL (ref 6–20)
CO2: 27 mmol/L (ref 22–32)
Calcium: 9.2 mg/dL (ref 8.9–10.3)
Chloride: 101 mmol/L (ref 98–111)
Creatinine, Ser: 0.51 mg/dL (ref 0.44–1.00)
GFR, Estimated: >60 mL/min (ref >60)
Glucose, Bld: 86 mg/dL (ref 70–99)
Potassium: 3.6 mmol/L (ref 3.5–5.1)
Sodium: 135 mmol/L (ref 135–145)
Total Bilirubin: 1 mg/dL (ref 0.3–1.2)
Total Protein: 7.1 g/dL (ref 6.5–8.1)

## 2020-11-18 MED ORDER — DICYCLOMINE HCL 20 MG PO TABS: 20.0000 mg | ORAL_TABLET | Freq: Three times a day (TID) | ORAL | 0 refills | Status: AC

## 2020-11-18 NOTE — ED Triage Notes (Signed)
Pt c/o abdominal pain since Tuesday. Pt also reports nausea and diarrhea, decreased appetite. Pt states she feels her abdomen is also bloated. Pt states pain is "searing and excruciating". Pt is not in any acute distress during triage, is laying down on bed and sitting up again with no issues.

## 2020-11-18 NOTE — Discharge Instructions (Addendum)
-  Your labs are all reassuring.  I do not suspect any sort of abdominal infection or emergent condition.  Symptoms are consistent with a stomach bug.  I have sent Bentyl which should help with the cramping and diarrhea.  If you have diarrhea again you can take Imodium.  Make sure to increase rest and fluids.  You can also take Tylenol or ibuprofen for pain relief. -He should go to ER if your abdominal pain worsens or you develop a fever.  Follow-up with PCP if abdominal pain is not resolved by next week.  ABDOMINAL PAIN/GASTROENTERITIS: Use medications as directed including antispasmodics and antidiarrheal medications. You must increase fluids and electrolyte replacement, as well as rest over these next several days. If you have any questions or concerns, or if your symptoms are not improving or if especially if they acutely worsen, please call or stop back to the clinic immediately and we will be happy to help you or go to the ER   ABDOMINAL PAIN RED FLAGS: Seek immediate further care if:  you are unable to keep fluids down, you see blood or mucus in your stool, you vomit black or dark red material, you have a fever of 101.F or higher, you have localized and/or persistent abdominal pain
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
## 2020-12-28 ENCOUNTER — Emergency Department
Admission: EM | Admit: 2020-12-28 | Discharge: 2020-12-28 | Discharge status: Against Medical Advice | Payer: Medicaid Other

## 2020-12-28 NOTE — ED Notes (Signed)
Pt to the registration desk, states that she just vomited and if she's not going to be seen soon she has to go, pt speaking with registration who encourages pt to wait to be triaged but pt states she can't wait and will go somewhere else to be seen

## 2021-01-04 DIAGNOSIS — F988 Other specified behavioral and emotional disorders with onset usually occurring in childhood and adolescence: Secondary | ICD-10-CM | POA: Insufficient documentation

## 2021-01-04 DIAGNOSIS — Z789 Other specified health status: Secondary | ICD-10-CM | POA: Insufficient documentation

## 2021-01-04 DIAGNOSIS — F32A Depression, unspecified: Secondary | ICD-10-CM | POA: Insufficient documentation

## 2021-02-20 ENCOUNTER — Emergency Department: Payer: Medicaid Other

## 2021-02-20 ENCOUNTER — Other Ambulatory Visit: Payer: Self-pay

## 2021-02-20 ENCOUNTER — Emergency Department
Admission: EM | Admit: 2021-02-20 | Discharge: 2021-02-20 | Disposition: A | Discharge status: Home / Self Care | Payer: Medicaid Other | Attending: Emergency Medicine | Admitting: Emergency Medicine

## 2021-02-20 DIAGNOSIS — F3131 Bipolar disorder, current episode depressed, mild: Secondary | ICD-10-CM | POA: Insufficient documentation

## 2021-02-20 DIAGNOSIS — R1011 Right upper quadrant pain: Secondary | ICD-10-CM | POA: Diagnosis not present

## 2021-02-20 DIAGNOSIS — R748 Abnormal levels of other serum enzymes: Secondary | ICD-10-CM

## 2021-02-20 DIAGNOSIS — R945 Abnormal results of liver function studies: Secondary | ICD-10-CM | POA: Insufficient documentation

## 2021-02-20 DIAGNOSIS — F32A Depression, unspecified: Secondary | ICD-10-CM | POA: Diagnosis present

## 2021-02-20 DIAGNOSIS — Y908 Blood alcohol level of 240 mg/100 ml or more: Secondary | ICD-10-CM | POA: Diagnosis not present

## 2021-02-20 DIAGNOSIS — Z79899 Other long term (current) drug therapy: Secondary | ICD-10-CM | POA: Diagnosis not present

## 2021-02-20 LAB — CBC
HCT: 44 % (ref 36.0–46.0)
Hemoglobin: 15.5 g/dL — ABNORMAL HIGH (ref 12.0–15.0)
MCH: 33.9 pg (ref 26.0–34.0)
MCHC: 35.2 g/dL (ref 30.0–36.0)
MCV: 96.3 fL (ref 80.0–100.0)
Platelets: 188 10*3/uL (ref 150–400)
RBC: 4.57 MIL/uL (ref 3.87–5.11)
RDW: 13.2 % (ref 11.5–15.5)
WBC: 7 10*3/uL (ref 4.0–10.5)
nRBC: 0 % (ref 0.0–0.2)

## 2021-02-20 LAB — COMPREHENSIVE METABOLIC PANEL
ALT: 186 U/L — ABNORMAL HIGH (ref 0–44)
AST: 215 U/L — ABNORMAL HIGH (ref 15–41)
Albumin: 3.4 g/dL — ABNORMAL LOW (ref 3.5–5.0)
Alkaline Phosphatase: 244 U/L — ABNORMAL HIGH (ref 38–126)
Anion gap: 11 (ref 5–15)
BUN: 17 mg/dL (ref 6–20)
CO2: 23 mmol/L (ref 22–32)
Calcium: 8.5 mg/dL — ABNORMAL LOW (ref 8.9–10.3)
Chloride: 103 mmol/L (ref 98–111)
Creatinine, Ser: 0.88 mg/dL (ref 0.44–1.00)
GFR, Estimated: >60 mL/min (ref >60)
Glucose, Bld: 137 mg/dL — ABNORMAL HIGH (ref 70–99)
Potassium: 4.6 mmol/L (ref 3.5–5.1)
Sodium: 137 mmol/L (ref 135–145)
Total Bilirubin: 1.4 mg/dL — ABNORMAL HIGH (ref 0.3–1.2)
Total Protein: 6.3 g/dL — ABNORMAL LOW (ref 6.5–8.1)

## 2021-02-20 LAB — URINE DRUG SCREEN, QUALITATIVE (ARMC ONLY)
Amphetamines, Ur Screen: NOT DETECTED
Barbiturates, Ur Screen: NOT DETECTED
Benzodiazepine, Ur Scrn: NOT DETECTED
Cannabinoid 50 Ng, Ur ~~LOC~~: POSITIVE — AB
Cocaine Metabolite,Ur ~~LOC~~: NOT DETECTED
MDMA (Ecstasy)Ur Screen: NOT DETECTED
Methadone Scn, Ur: NOT DETECTED
Opiate, Ur Screen: NOT DETECTED
Phencyclidine (PCP) Ur S: NOT DETECTED
Tricyclic, Ur Screen: NOT DETECTED

## 2021-02-20 LAB — TSH: TSH: 1.14 u[IU]/mL (ref 0.350–4.500)

## 2021-02-20 LAB — LITHIUM LEVEL: Lithium Lvl: 0.4 mmol/L — ABNORMAL LOW (ref 0.60–1.20)

## 2021-02-20 LAB — ETHANOL: Alcohol, Ethyl (B): 327 mg/dL (ref <10)

## 2021-02-20 LAB — POC URINE PREG, ED: Preg Test, Ur: NEGATIVE

## 2021-02-20 MED ORDER — LORAZEPAM 1 MG PO TABS
1.0000 mg | ORAL_TABLET | Freq: Once | ORAL | Status: AC
  Administered 2021-02-20: 1 mg via ORAL
  Filled 2021-02-20: qty 1

## 2021-02-20 MED ORDER — CHLORDIAZEPOXIDE HCL 25 MG PO CAPS: ORAL_CAPSULE | ORAL | 0 refills | Status: AC

## 2021-02-20 MED ORDER — CHLORDIAZEPOXIDE HCL 25 MG PO CAPS
25.0000 mg | ORAL_CAPSULE | Freq: Once | ORAL | Status: AC
Start: 2021-02-20 — End: 2021-02-20
  Administered 2021-02-20: 25 mg via ORAL
  Filled 2021-02-20: qty 1

## 2021-02-20 MED ORDER — SODIUM CHLORIDE 0.9 % IV BOLUS
1000.0000 mL | Freq: Once | INTRAVENOUS | Status: AC
  Administered 2021-02-20: 1000 mL via INTRAVENOUS

## 2021-02-20 NOTE — ED Notes (Signed)
Pt with sudden jerking movements.  EDP made aware.

## 2021-02-20 NOTE — ED Notes (Signed)
EDP made aware of ETOH level

## 2021-02-20 NOTE — ED Provider Notes (Signed)
[facility] Provider Note    Event Date/Time   First MD Initiated Contact with Patient 02/20/21 1828     (approximate)   History   detox   HPI  Gina Price is a 31 y.o. female here with multiple complaints.  The patient states that she was recently hospitalized at [facility] for depression and bipolar disease.  She was started on lithium at that time.  She states that she is run out of this over the last week, and since then has been having depression, increasing anxiety, and subsequently has been drinking very heavily.  She tried to follow-up with her PCP who is not comfortable calling in the lithium, so she has been drinking more and more and subsequent presents for help.  She states that whenever she tries to stop drinking she feels tremulous, though she is only been drinking for the last 2 weeks.  She states she has a history of alcohol dependence and withdrawal.  She said no seizures.  Denies any suicidal homicidal ideation.  She resides with her significant other who she states is a significant support.  She is interested in stopping drinking.  She does not currently have an outpatient psychiatrist that she is been able to set up a follow-up.     Physical Exam   Triage Vital Signs: ED Triage Vitals  Enc Vitals Group     BP 02/20/21 1755 120/82     Pulse Rate 02/20/21 1755 (!) 114     Resp 02/20/21 1755 19     Temp 02/20/21 1755 98 F (36.7 C)     Temp src --      SpO2 02/20/21 1755 97 %     Weight --      Height --      Head Circumference --      Peak Flow --      Pain Score 02/20/21 1752 5     Pain Loc --      Pain Edu? --      Excl. in GC? --     Most recent vital signs: Vitals:   02/20/21 1755 02/20/21 2209  BP: 120/82 111/71  Pulse: (!) 114 88  Resp: 19 16  Temp: 98 F (36.7 C) 98.1 F (36.7 C)  SpO2: 97% 99%     General: Awake, no distress.  CV:  Good peripheral perfusion.  Resp:  Normal effort.  Abd:  No distention.  No  tenderness.  Specifically, no right upper quadrant tenderness, guarding, or rebound. Other:  Alert, oriented.  No tremors.  No focal neurological deficits.  Denies SI, HI, does not appear to be hallucinating.   ED Results / Procedures / Treatments   Labs (all labs ordered are listed, but only abnormal results are displayed) Labs Reviewed  COMPREHENSIVE METABOLIC PANEL - Abnormal; Notable for the following components:      Result Value   Glucose, Bld 137 (*)    Calcium 8.5 (*)    Total Protein 6.3 (*)    Albumin 3.4 (*)    AST 215 (*)    ALT 186 (*)    Alkaline Phosphatase 244 (*)    Total Bilirubin 1.4 (*)    All other components within normal limits  ETHANOL - Abnormal; Notable for the following components:   Alcohol, Ethyl (B) 327 (*)    All other components within normal limits  CBC - Abnormal; Notable for the following components:   Hemoglobin 15.5 (*)  All other components within normal limits  URINE DRUG SCREEN, QUALITATIVE ([facility] ONLY) - Abnormal; Notable for the following components:   Cannabinoid 50 Ng, Ur [place] POSITIVE (*)    All other components within normal limits  LITHIUM LEVEL - Abnormal; Notable for the following components:   Lithium Lvl 0.40 (*)    All other components within normal limits  TSH  LIPASE, BLOOD  POC URINE PREG, ED     EKG    RADIOLOGY Right quadrant ultrasound: Fatty liver, otherwise negative   I also independently reviewed and agree wit radiologist interpretations.   PROCEDURES:  Critical Care performed: No     MEDICATIONS ORDERED IN ED: Medications  sodium chloride 0.9 % bolus 1,000 mL (0 mLs Intravenous Stopped 02/20/21 2309)  LORazepam (ATIVAN) tablet 1 mg (1 mg Oral Given 02/20/21 1852)  chlordiazePOXIDE (LIBRIUM) capsule 25 mg (25 mg Oral Given 02/20/21 2208)     IMPRESSION / MDM / ASSESSMENT AND PLAN / ED COURSE  I reviewed the triage vital signs and the nursing notes.                                MDM:   31 year old female here with increasing alcohol use and depression type symptoms in the setting of running out of her lithium and drinking fairly heavily.  She is adamant she has no SI, HI, and seems to have a good support structure in place with multiple protective factors.  I suspect her alcohol use is self-medicating for her underlying psychiatric disease.  She does not appear to be actively withdrawing and alcohol level is elevated here.  CBC is unremarkable.  Lithium 0.4 consistent with her history of not having this.  TSH normal.  CMP remarkable for transaminitis with AST and ALT of 250 86, respectively, as well as elevated bilirubin.  UDS positive for marijuana but otherwise negative.  Regarding her bipolar disorder, she endorses depressive symptoms but no SI and has multiple protective factors intentioned above.  She expresses strong interest in stopping drinking.  Discussed that while I am unable to fill her lithium, we can trial Librium to help with her alcohol withdrawal and use, and this should also assist with some anxiety while she is waiting to get in with psychiatry.  TTS evaluated and has provided her with multiple resources to facilitate this.  Regarding her transaminitis, she states that she has been told she may have an autoimmune GI issue.  She has not been referred for this.  She has no right upper quadrant tenderness, guarding, rebound.  Ultrasound shows fatty liver but no other abnormality.  Suspect this could be related to fatty liver as well as alcohol use, but referred to GI given the degree of inflammation.  Advised her to avoid Tylenol as well as alcohol.  MEDICATIONS GIVEN IN ED: Medications  sodium chloride 0.9 % bolus 1,000 mL (0 mLs Intravenous Stopped 02/20/21 2309)  LORazepam (ATIVAN) tablet 1 mg (1 mg Oral Given 02/20/21 1852)  chlordiazePOXIDE (LIBRIUM) capsule 25 mg (25 mg Oral Given 02/20/21 2208)     Consults:  TTS   EMR reviewed  Prior ED visits, PCP note  for n/v     FINAL CLINICAL IMPRESSION(S) / ED DIAGNOSES   Final diagnoses:  RUQ pain  Elevated liver enzymes  Bipolar affective disorder, currently depressed, mild (HCC)     Rx / DC Orders   ED Discharge Orders  Ordered    chlordiazePOXIDE (LIBRIUM) 25 MG capsule        02/20/21 2159             Note:  This document was prepared using Dragon voice recognition software and may include unintentional dictation errors.   Shaune PollackIsaacs, Safal Halderman, MD 02/20/21 [id number]

## 2021-02-20 NOTE — BH Assessment (Signed)
TTS arrived to patient's bedside, patient denies wanting inpatient detox treatment and is requesting Outpatient Substance abuse treatment. Patient was provided with the following Outpatient referrals:  [facility] [address], [facility] [address] [place]  [facility] [address]  Communicated this to EDP Dr. Erma Heritage

## 2021-02-20 NOTE — ED Triage Notes (Signed)
Pt comes with c/o detox. Pt states she has been clean for over a month with alcohol. Pt was prescribed lithium. Pt states she ran out and has been out for 2-3 days. Pt states the pain was so bad so then she ended up drinking to help. Pt states last few days she has been drinking. Pt last drink was right before she got here. Pt states she drank 4 glasses of wine.  Pt denies any SI or HI. Pt state she really just wants to get back on her lithium and off of the alcohol.

## 2021-02-20 NOTE — ED Notes (Signed)
Pt tearful and states she is having pain all through her body.  Pt also reports tremors.  Pt says she has not had her lithium "for some time."

## 2021-02-20 NOTE — ED Notes (Signed)
VOL, pend psych consult 

## 2021-03-18 ENCOUNTER — Emergency Department: Payer: Medicaid Other

## 2021-03-18 ENCOUNTER — Other Ambulatory Visit: Payer: Self-pay

## 2021-03-18 ENCOUNTER — Inpatient Hospital Stay
Admission: EM | Admit: 2021-03-18 | Discharge: 2021-03-21 | DRG: 432 | Disposition: A | Discharge status: Home / Self Care | Payer: Medicaid Other | Attending: Internal Medicine | Admitting: Internal Medicine

## 2021-03-18 ENCOUNTER — Encounter: Payer: Self-pay | Admitting: Emergency Medicine

## 2021-03-18 DIAGNOSIS — E785 Hyperlipidemia, unspecified: Secondary | ICD-10-CM | POA: Diagnosis present

## 2021-03-18 DIAGNOSIS — F419 Anxiety disorder, unspecified: Secondary | ICD-10-CM | POA: Diagnosis present

## 2021-03-18 DIAGNOSIS — Z8249 Family history of ischemic heart disease and other diseases of the circulatory system: Secondary | ICD-10-CM

## 2021-03-18 DIAGNOSIS — K7200 Acute and subacute hepatic failure without coma: Secondary | ICD-10-CM | POA: Diagnosis present

## 2021-03-18 DIAGNOSIS — E8809 Other disorders of plasma-protein metabolism, not elsewhere classified: Secondary | ICD-10-CM | POA: Diagnosis present

## 2021-03-18 DIAGNOSIS — K760 Fatty (change of) liver, not elsewhere classified: Secondary | ICD-10-CM | POA: Diagnosis present

## 2021-03-18 DIAGNOSIS — Y908 Blood alcohol level of 240 mg/100 ml or more: Secondary | ICD-10-CM | POA: Diagnosis present

## 2021-03-18 DIAGNOSIS — F1010 Alcohol abuse, uncomplicated: Secondary | ICD-10-CM | POA: Diagnosis present

## 2021-03-18 DIAGNOSIS — K759 Inflammatory liver disease, unspecified: Secondary | ICD-10-CM | POA: Diagnosis not present

## 2021-03-18 DIAGNOSIS — K7010 Alcoholic hepatitis without ascites: Secondary | ICD-10-CM | POA: Diagnosis present

## 2021-03-18 DIAGNOSIS — K7290 Hepatic failure, unspecified without coma: Secondary | ICD-10-CM

## 2021-03-18 DIAGNOSIS — I10 Essential (primary) hypertension: Secondary | ICD-10-CM | POA: Diagnosis present

## 2021-03-18 DIAGNOSIS — Z20822 Contact with and (suspected) exposure to covid-19: Secondary | ICD-10-CM | POA: Diagnosis present

## 2021-03-18 DIAGNOSIS — F909 Attention-deficit hyperactivity disorder, unspecified type: Secondary | ICD-10-CM | POA: Diagnosis present

## 2021-03-18 DIAGNOSIS — R52 Pain, unspecified: Secondary | ICD-10-CM

## 2021-03-18 DIAGNOSIS — F319 Bipolar disorder, unspecified: Secondary | ICD-10-CM | POA: Diagnosis present

## 2021-03-18 DIAGNOSIS — D689 Coagulation defect, unspecified: Secondary | ICD-10-CM | POA: Diagnosis present

## 2021-03-18 LAB — URINALYSIS, ROUTINE W REFLEX MICROSCOPIC
Bilirubin Urine: NEGATIVE
Glucose, UA: NEGATIVE mg/dL
Hgb urine dipstick: NEGATIVE
Ketones, ur: NEGATIVE mg/dL
Leukocytes,Ua: NEGATIVE
Nitrite: NEGATIVE
Protein, ur: NEGATIVE mg/dL
Specific Gravity, Urine: 1.004 — ABNORMAL LOW (ref 1.005–1.030)
pH: 6 (ref 5.0–8.0)

## 2021-03-18 LAB — PROTIME-INR
INR: 1.5 — ABNORMAL HIGH (ref 0.8–1.2)
Prothrombin Time: 18.4 seconds — ABNORMAL HIGH (ref 11.4–15.2)

## 2021-03-18 LAB — COMPREHENSIVE METABOLIC PANEL
ALT: 461 U/L — ABNORMAL HIGH (ref 0–44)
AST: 734 U/L — ABNORMAL HIGH (ref 15–41)
Albumin: 3.4 g/dL — ABNORMAL LOW (ref 3.5–5.0)
Alkaline Phosphatase: 187 U/L — ABNORMAL HIGH (ref 38–126)
Anion gap: 8 (ref 5–15)
BUN: 8 mg/dL (ref 6–20)
CO2: 23 mmol/L (ref 22–32)
Calcium: 8.4 mg/dL — ABNORMAL LOW (ref 8.9–10.3)
Chloride: 102 mmol/L (ref 98–111)
Creatinine, Ser: 0.55 mg/dL (ref 0.44–1.00)
GFR, Estimated: >60 mL/min (ref >60)
Glucose, Bld: 109 mg/dL — ABNORMAL HIGH (ref 70–99)
Potassium: 3.8 mmol/L (ref 3.5–5.1)
Sodium: 133 mmol/L — ABNORMAL LOW (ref 135–145)
Total Bilirubin: 2 mg/dL — ABNORMAL HIGH (ref 0.3–1.2)
Total Protein: 5.9 g/dL — ABNORMAL LOW (ref 6.5–8.1)

## 2021-03-18 LAB — RESP PANEL BY RT-PCR (FLU A&B, COVID) ARPGX2
Influenza A by PCR: NEGATIVE
Influenza B by PCR: NEGATIVE
SARS Coronavirus 2 by RT PCR: NEGATIVE

## 2021-03-18 LAB — CBC
HCT: 41 % (ref 36.0–46.0)
Hemoglobin: 13.7 g/dL (ref 12.0–15.0)
MCH: 31.6 pg (ref 26.0–34.0)
MCHC: 33.4 g/dL (ref 30.0–36.0)
MCV: 94.7 fL (ref 80.0–100.0)
Platelets: 319 10*3/uL (ref 150–400)
RBC: 4.33 MIL/uL (ref 3.87–5.11)
RDW: 13.9 % (ref 11.5–15.5)
WBC: 6.6 10*3/uL (ref 4.0–10.5)
nRBC: 0 % (ref 0.0–0.2)

## 2021-03-18 LAB — LACTIC ACID, PLASMA: Lactic Acid, Venous: 1.3 mmol/L (ref 0.5–1.9)

## 2021-03-18 LAB — AMMONIA: Ammonia: 38 umol/L — ABNORMAL HIGH (ref 9–35)

## 2021-03-18 LAB — HEPATIC FUNCTION PANEL
ALT: 450 U/L — ABNORMAL HIGH (ref 0–44)
AST: 687 U/L — ABNORMAL HIGH (ref 15–41)
Albumin: 3.4 g/dL — ABNORMAL LOW (ref 3.5–5.0)
Alkaline Phosphatase: 182 U/L — ABNORMAL HIGH (ref 38–126)
Bilirubin, Direct: 1.2 mg/dL — ABNORMAL HIGH (ref 0.0–0.2)
Indirect Bilirubin: 0.8 mg/dL (ref 0.3–0.9)
Total Bilirubin: 2 mg/dL — ABNORMAL HIGH (ref 0.3–1.2)
Total Protein: 5.7 g/dL — ABNORMAL LOW (ref 6.5–8.1)

## 2021-03-18 LAB — LITHIUM LEVEL: Lithium Lvl: 0.41 mmol/L — ABNORMAL LOW (ref 0.60–1.20)

## 2021-03-18 LAB — POC URINE PREG, ED: Preg Test, Ur: NEGATIVE

## 2021-03-18 LAB — MAGNESIUM: Magnesium: 2.1 mg/dL (ref 1.7–2.4)

## 2021-03-18 LAB — PROCALCITONIN: Procalcitonin: 1.11 ng/mL

## 2021-03-18 LAB — CK: Total CK: 62 U/L (ref 38–234)

## 2021-03-18 LAB — LIPASE, BLOOD: Lipase: 52 U/L — ABNORMAL HIGH (ref 11–51)

## 2021-03-18 LAB — ACETAMINOPHEN LEVEL: Acetaminophen (Tylenol), Serum: <10 ug/mL — ABNORMAL LOW (ref 10–30)

## 2021-03-18 LAB — ETHANOL: Alcohol, Ethyl (B): 353 mg/dL (ref <10)

## 2021-03-18 MED ORDER — ONDANSETRON HCL 4 MG/2ML IJ SOLN
INTRAMUSCULAR | Status: AC
Start: 2021-03-18 — End: 2021-03-19
  Filled 2021-03-18: qty 2

## 2021-03-18 MED ORDER — AMPHETAMINE-DEXTROAMPHET ER 5 MG PO CP24
25.0000 mg | ORAL_CAPSULE | Freq: Every morning | ORAL | Status: DC
  Administered 2021-03-20: 25 mg via ORAL
  Filled 2021-03-18 (×2): qty 5

## 2021-03-18 MED ORDER — LISINOPRIL 5 MG PO TABS
5.0000 mg | ORAL_TABLET | Freq: Every morning | ORAL | Status: DC
  Administered 2021-03-19 – 2021-03-21 (×2): 5 mg via ORAL
  Filled 2021-03-18: qty 1

## 2021-03-18 MED ORDER — ONDANSETRON HCL 4 MG/2ML IJ SOLN
4.0000 mg | Freq: Four times a day (QID) | INTRAMUSCULAR | Status: DC | PRN
  Administered 2021-03-18: 4 mg via INTRAVENOUS

## 2021-03-18 MED ORDER — LORAZEPAM 1 MG PO TABS
1.0000 mg | ORAL_TABLET | Freq: Once | ORAL | Status: AC
  Administered 2021-03-19: 1 mg via ORAL
  Filled 2021-03-18: qty 1

## 2021-03-18 MED ORDER — LITHIUM CARBONATE 300 MG PO CAPS
600.0000 mg | ORAL_CAPSULE | Freq: Every day | ORAL | Status: DC
  Administered 2021-03-18 – 2021-03-20 (×3): 600 mg via ORAL
  Filled 2021-03-18 (×5): qty 2

## 2021-03-18 MED ORDER — HYDROMORPHONE HCL 1 MG/ML IJ SOLN
0.5000 mg | Freq: Once | INTRAMUSCULAR | Status: AC
  Administered 2021-03-18: 0.5 mg via INTRAVENOUS
  Filled 2021-03-18: qty 1

## 2021-03-18 MED ORDER — KETOROLAC TROMETHAMINE 30 MG/ML IJ SOLN
15.0000 mg | Freq: Once | INTRAMUSCULAR | Status: AC
Start: 2021-03-18 — End: 2021-03-18
  Administered 2021-03-18: 15 mg via INTRAVENOUS
  Filled 2021-03-18: qty 1

## 2021-03-18 MED ORDER — LORAZEPAM 1 MG PO TABS
1.0000 mg | ORAL_TABLET | ORAL | Status: DC | PRN
  Administered 2021-03-21: 1 mg via ORAL
  Filled 2021-03-18: qty 1

## 2021-03-18 MED ORDER — SODIUM CHLORIDE 0.9 % IV BOLUS
1000.0000 mL | Freq: Once | INTRAVENOUS | Status: AC
  Administered 2021-03-18: 1000 mL via INTRAVENOUS

## 2021-03-18 MED ORDER — LORAZEPAM 2 MG/ML IJ SOLN
1.0000 mg | INTRAMUSCULAR | Status: DC | PRN
  Administered 2021-03-18: 1 mg via INTRAVENOUS
  Filled 2021-03-18: qty 1

## 2021-03-18 MED ORDER — THIAMINE HCL 100 MG/ML IJ SOLN
100.0000 mg | Freq: Every day | INTRAMUSCULAR | Status: DC
  Administered 2021-03-21: 100 mg via INTRAVENOUS
  Filled 2021-03-18 (×2): qty 2

## 2021-03-18 MED ORDER — BUSPIRONE HCL 5 MG PO TABS: 7.5000 mg | ORAL_TABLET | Freq: Three times a day (TID) | ORAL | Status: DC

## 2021-03-18 MED ORDER — DEXTROSE 5 % IV SOLN
15.0000 mg/kg/h | INTRAVENOUS | Status: DC
  Administered 2021-03-18 – 2021-03-20 (×2): 15 mg/kg/h via INTRAVENOUS
  Filled 2021-03-18 (×4): qty 90

## 2021-03-18 MED ORDER — IBUPROFEN 400 MG PO TABS
600.0000 mg | ORAL_TABLET | Freq: Four times a day (QID) | ORAL | Status: DC | PRN
  Administered 2021-03-18 – 2021-03-20 (×4): 600 mg via ORAL
  Filled 2021-03-18 (×5): qty 2

## 2021-03-18 MED ORDER — ADULT MULTIVITAMIN W/MINERALS CH
1.0000 | ORAL_TABLET | Freq: Every day | ORAL | Status: DC
  Administered 2021-03-18 – 2021-03-21 (×3): 1 via ORAL
  Filled 2021-03-18 (×2): qty 1

## 2021-03-18 MED ORDER — VITAMIN K1 10 MG/ML IJ SOLN
10.0000 mg | Freq: Every day | INTRAVENOUS | Status: AC
  Administered 2021-03-18 – 2021-03-20 (×3): 10 mg via INTRAVENOUS
  Filled 2021-03-18 (×3): qty 1

## 2021-03-18 MED ORDER — DICYCLOMINE HCL 20 MG PO TABS
20.0000 mg | ORAL_TABLET | Freq: Three times a day (TID) | ORAL | Status: DC
  Administered 2021-03-18 – 2021-03-21 (×7): 20 mg via ORAL
  Filled 2021-03-18 (×10): qty 1

## 2021-03-18 MED ORDER — ACETYLCYSTEINE LOAD VIA INFUSION
150.0000 mg/kg | Freq: Once | INTRAVENOUS | Status: AC
  Administered 2021-03-18: 8850 mg via INTRAVENOUS
  Filled 2021-03-18: qty 291

## 2021-03-18 MED ORDER — LINACLOTIDE 72 MCG PO CAPS
72.0000 ug | ORAL_CAPSULE | Freq: Every day | ORAL | Status: DC
  Filled 2021-03-18 (×4): qty 1

## 2021-03-18 MED ORDER — THIAMINE HCL 100 MG PO TABS
100.0000 mg | ORAL_TABLET | Freq: Every day | ORAL | Status: DC
  Administered 2021-03-18 – 2021-03-21 (×3): 100 mg via ORAL
  Filled 2021-03-18 (×2): qty 1

## 2021-03-18 MED ORDER — FOLIC ACID 1 MG PO TABS
1.0000 mg | ORAL_TABLET | Freq: Every day | ORAL | Status: DC
  Administered 2021-03-18 – 2021-03-21 (×3): 1 mg via ORAL
  Filled 2021-03-18 (×2): qty 1

## 2021-03-18 NOTE — ED Notes (Signed)
EDP was at bedside with pt again explaining plan of care.

## 2021-03-18 NOTE — ED Notes (Signed)
Loading dose for acetylcysteine not sent yet by pharmacy. Sent med message.  Hospitalist saw pt at bedside. Not signed into pt chart but will message him to clarify ibuprofen order due to potential bleeding risk.

## 2021-03-18 NOTE — ED Triage Notes (Signed)
C/O generalized body pain x 6 weeks.  Has history of elevated liver enzymes, has appointment with GI next week, but unsure if she will be able to afford copay.  Also c/o muscle twitching / spasms intermittently x 6 weeks.  Patient is AAOx3.  Skin warm and dry.NAD

## 2021-03-18 NOTE — ED Notes (Signed)
CIWA score deferred as last drink was 0800 today and alcohol level was elevated on bloodwork. Pt is daily drinker, states the amount she drinks "varies".

## 2021-03-18 NOTE — ED Notes (Signed)
Pt in ultrasound. Will obtain covid swab, ammonia, VS when pt returns.

## 2021-03-18 NOTE — ED Notes (Signed)
Only 1 pump channel. Will finish vit K then start next med.
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
## 2021-03-18 NOTE — ED Notes (Signed)
Informed RN bed assigned 

## 2021-03-18 NOTE — ED Provider Notes (Signed)
[facility] Provider Note    Event Date/Time   First MD Initiated Contact with Patient 03/18/21 1222     (approximate)   History   Generalized Body Aches   HPI  Gina Price is a 31 y.o. female with alcohol abuse who comes in with concerns for generalized body aches.  Patient reports worsening body aches all over her joints in her body.  She reports easy bruising and is overall not feeling well.  She reports having abnormal liver function test and is post get follow-up with GI but has not been able to yet.  She states she is not sure if she can follow-up with the co-pay.  She does report some occasional Tylenol use but no more than 2 g in a day.  She also does report alcohol use and did last drink this morning but she states that she is only drinking to help with the pain.  Reviewed patient's ER note from 1/22 where she had elevated LFTs, AST 215, ALT 186 and alk phos 244 with a total bili of 1.4.  Ultrasound showed fatty liver  Physical Exam   Triage Vital Signs: ED Triage Vitals  Enc Vitals Group     BP 03/18/21 1152 115/70     Pulse Rate 03/18/21 1152 (!) 109     Resp 03/18/21 1152 16     Temp 03/18/21 1152 98.6 F (37 C)     Temp Source 03/18/21 1152 Oral     SpO2 03/18/21 1152 99 %     Weight 03/18/21 1152 130 lb 1.1 oz (59 kg)     Height 03/18/21 1152 5' 1" (1.549 m)     Head Circumference --      Peak Flow --      Pain Score 03/18/21 1150 7     Pain Loc --      Pain Edu? --      Excl. in [place]? --     Most recent vital signs: Vitals:   03/18/21 1152  BP: 115/70  Pulse: (!) 109  Resp: 16  Temp: 98.6 F (37 C)  SpO2: 99%     General: Awake, no distress.  CV:  Good peripheral perfusion.  Resp:  Normal effort.  Abd:  No distention.  Soft and nontender Other:  Patient reporting joint pain all over.   ED Results / Procedures / Treatments   Labs (all labs ordered are listed, but only abnormal results are displayed) Labs Reviewed   POC URINE PREG, ED - Normal  LIPASE, BLOOD  COMPREHENSIVE METABOLIC PANEL  CBC  URINALYSIS, ROUTINE W REFLEX MICROSCOPIC  CK   RADIOLOGY I have reviewed the [place] personally -pending results   PROCEDURES:  Critical Care performed: Yes, see critical care procedure note(s)  .Critical Care Performed by: Vanessa [place], MD Authorized by: Vanessa Imbler, MD   Critical care provider statement:    Critical care time (minutes):  30   Critical care was necessary to treat or prevent imminent or life-threatening deterioration of the following conditions:  Hepatic failure   Critical care was time spent personally by me on the following activities:  Development of treatment plan with patient or surrogate, discussions with consultants, evaluation of patient's response to treatment, examination of patient, ordering and review of laboratory studies, ordering and review of radiographic studies, ordering and performing treatments and interventions, pulse oximetry, re-evaluation of patient's condition and review of old charts   [place] ED: Medications -  No data to display   IMPRESSION / MDM / Ali Molina / ED COURSE  I reviewed the triage vital signs and the nursing notes.                              Differential diagnosis includes, but is not limited to, liver failure, hepatitis, alcohol use, some tylenol use.   Lipase is slightly elevated 52 but nonspecific INR elevated at 1.5 Mag normal Lithium level negative Tylenol negative CMP shows elevated T. bili and liver function test  Discussed with Dr. Vicente Males from GI who recommends transfer given elevated INR at the cutoff is 1.5 to consider this acute liver failure He recommends going to a hospital that could be transplant if needed such as [facility] or [facility]  3:01 PM-closed to transfer but will let me consult on with hepatology.   Discussed with Dr. Manson Allan from [facility] who recommends treating with NAC 72 hours per Tylenol protocols,  vitamin K 10 mg for 3 days, ensuring that she is not on been on any antibiotics or herbal dietary medicine, hepatitis panel and call back immediately for changes in mental status or rising INR tomorrow  Tried to also transfer to [facility] but they are also closed but is able to talk to the hepatologist there  Discussed with the [place] hepatologist Dr. Posey Pronto who recommended also checking another liver function test now and if LFTs have risen more than thousand he started her on prophylactic acyclovir until HSV panel is back  Patient denies any history of herpes, denies any history of antibiotic use or herbal dietary supplementation.  Recommend daily morning INR's.  They recommend reconsulting them for worsening mental status or rising INR.  Discussed with the hospital team for admission            FINAL CLINICAL IMPRESSION(S) / ED DIAGNOSES   Final diagnoses:  Pain  Alcohol abuse  Acute liver failure without hepatic coma     Rx / DC Orders   ED Discharge Orders     None        Note:  This document was prepared using Dragon voice recognition software and may include unintentional dictation errors.   Vanessa [place], MD 03/18/21 [phone]

## 2021-03-18 NOTE — Progress Notes (Signed)
RUQ U/S reviewed.  Image study suspicious for acalculus cholecystitis, physical exam showed no Murphy signs and no ultrasonic Murphy signs.  No leukocytosis or fever, decided to monitor off antibiotics, will order HIDA scan.

## 2021-03-19 ENCOUNTER — Encounter: Payer: Self-pay | Admitting: Internal Medicine

## 2021-03-19 ENCOUNTER — Inpatient Hospital Stay: Payer: Medicaid Other

## 2021-03-19 DIAGNOSIS — K7200 Acute and subacute hepatic failure without coma: Secondary | ICD-10-CM

## 2021-03-19 DIAGNOSIS — F1010 Alcohol abuse, uncomplicated: Secondary | ICD-10-CM

## 2021-03-19 DIAGNOSIS — K7010 Alcoholic hepatitis without ascites: Principal | ICD-10-CM

## 2021-03-19 LAB — HEPATITIS PANEL, ACUTE
HCV Ab: NONREACTIVE
Hep A IgM: NONREACTIVE
Hep B C IgM: NONREACTIVE
Hepatitis B Surface Ag: NONREACTIVE

## 2021-03-19 LAB — HEPATITIS A ANTIBODY, IGM: Hep A IgM: NONREACTIVE

## 2021-03-19 LAB — PROCALCITONIN: Procalcitonin: 1.01 ng/mL

## 2021-03-19 LAB — COMPREHENSIVE METABOLIC PANEL
ALT: 349 U/L — ABNORMAL HIGH (ref 0–44)
AST: 512 U/L — ABNORMAL HIGH (ref 15–41)
Albumin: 2.9 g/dL — ABNORMAL LOW (ref 3.5–5.0)
Alkaline Phosphatase: 137 U/L — ABNORMAL HIGH (ref 38–126)
Anion gap: 9 (ref 5–15)
BUN: 6 mg/dL (ref 6–20)
CO2: 25 mmol/L (ref 22–32)
Calcium: 8.3 mg/dL — ABNORMAL LOW (ref 8.9–10.3)
Chloride: 99 mmol/L (ref 98–111)
Creatinine, Ser: 0.6 mg/dL (ref 0.44–1.00)
GFR, Estimated: >60 mL/min (ref >60)
Glucose, Bld: 101 mg/dL — ABNORMAL HIGH (ref 70–99)
Potassium: 3.6 mmol/L (ref 3.5–5.1)
Sodium: 133 mmol/L — ABNORMAL LOW (ref 135–145)
Total Bilirubin: 2.1 mg/dL — ABNORMAL HIGH (ref 0.3–1.2)
Total Protein: 5 g/dL — ABNORMAL LOW (ref 6.5–8.1)

## 2021-03-19 LAB — IRON AND TIBC
Iron: 152 ug/dL (ref 28–170)
Saturation Ratios: 94 % — ABNORMAL HIGH (ref 10.4–31.8)
TIBC: 162 ug/dL — ABNORMAL LOW (ref 250–450)
UIBC: 10 ug/dL

## 2021-03-19 LAB — PROTIME-INR
INR: 1.5 — ABNORMAL HIGH (ref 0.8–1.2)
Prothrombin Time: 18.2 seconds — ABNORMAL HIGH (ref 11.4–15.2)

## 2021-03-19 LAB — CBC
HCT: 35.3 % — ABNORMAL LOW (ref 36.0–46.0)
Hemoglobin: 12 g/dL (ref 12.0–15.0)
MCH: 31.9 pg (ref 26.0–34.0)
MCHC: 34 g/dL (ref 30.0–36.0)
MCV: 93.9 fL (ref 80.0–100.0)
Platelets: 186 10*3/uL (ref 150–400)
RBC: 3.76 MIL/uL — ABNORMAL LOW (ref 3.87–5.11)
RDW: 13.7 % (ref 11.5–15.5)
WBC: 4.5 10*3/uL (ref 4.0–10.5)
nRBC: 0 % (ref 0.0–0.2)

## 2021-03-19 LAB — HEPATITIS B CORE ANTIBODY, IGM: Hep B C IgM: NONREACTIVE

## 2021-03-19 LAB — TSH: TSH: 0.939 u[IU]/mL (ref 0.350–4.500)

## 2021-03-19 LAB — HEPATITIS B CORE ANTIBODY, TOTAL: Hep B Core Total Ab: NONREACTIVE

## 2021-03-19 LAB — FERRITIN: Ferritin: 2983 ng/mL — ABNORMAL HIGH (ref 11–307)
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
## 2021-03-19 NOTE — Progress Notes (Addendum)
Progress Note    Gina Price  WUJ:[id number] DOB: 22-Jun-1990  DOA: 03/18/2021 PCP: System, Provider Not In      Brief Narrative:    Medical records reviewed and are as summarized below:  Gina Price is a 31 y.o. female with medical history significant for alcohol use disorder, ADHD, hypertension, hyperlipidemia, anxiety, depression, IBS who presented to the hospital because of generalized body pains, abdominal pain and jaundice.  She also had an itchy rash on her arms and legs but this has improved.  She was seen in the emergency room about 3 weeks prior to admission and had ultrasound showed fatty liver.  She was advised to see a gastroenterologist in the outpatient setting at that time.      Assessment/Plan:   Principal Problem:   Hepatitis Active Problems:   Alcoholic hepatitis   Body mass index is 24.58 kg/m.   Acute hepatitis, likely alcoholic hepatitis with coagulopathy and hypoalbuminemia: S/p IV vitamin K.  No indication for steroids at this time.  Monitor INR and liver enzymes.  Hepatitis panel is pending.  Serologic work-up for hepatitis is pending.  Liver ultrasound was concerning for acalculus cholecystitis.  HIDA scan has been ordered for further evaluation.  Consulted surgeon to assist with management, Dr. Aleen Campi.  Follow-up with GI.  General body aches: Likely from acute hepatitis.  Analgesics as needed for pain.  Alcohol use disorder: She is on benzodiazepines as needed per CIWA protocol  Bipolar disorder, anxiety, depression: Continue lithium and buspirone  Of note, patient said she has not taken zonisamide for many years.  She said it was prescribed for migraine/tinnitus and she probably took it only twice and has not taken it since.  She has no history of seizures.  Diet Order             Diet Heart Room service appropriate? Yes; Fluid consistency: Thin  Diet effective now                     Consultants: Gastroenterologist  Procedures: None    Medications:    amphetamine-dextroamphetamine  25 mg Oral q morning   dicyclomine  20 mg Oral TID AC & HS   folic acid  1 mg Oral Daily   linaclotide  72 mcg Oral Daily   lisinopril  5 mg Oral q morning   lithium  600 mg Oral QHS   LORazepam  1 mg Oral Once   multivitamin with minerals  1 tablet Oral Daily   thiamine  100 mg Oral Daily   Or   thiamine  100 mg Intravenous Daily   Continuous Infusions:  acetylcysteine 15 mg/kg/hr (03/19/21 0612)   phytonadione (VITAMIN K) IV 10 mg (03/19/21 1012)     Anti-infectives (From admission, onward)    None              Family Communication/Anticipated D/C date and plan/Code Status   DVT prophylaxis: SCDs Start: 03/18/21 1720     Code Status: Full Code  Family Communication: None Disposition Plan: Plan to discharge home in 2 to 3 days   Status is: Inpatient Remains inpatient appropriate because: Acute hepatitis pending further work-up               Subjective:   Interval events noted.  She complains of epigastric pain, right upper quadrant pain, generalized body and joint pain.  She has nausea and vomited once this morning.  Objective:    Vitals:  03/18/21 2117 03/18/21 2206 03/19/21 0402 03/19/21 0720  BP: 109/75 123/87 113/80 124/65  Pulse: 81 84 87 85  Resp: 17 18 18 16   Temp:  97.7 F (36.5 C) 98.9 F (37.2 C) 98.5 F (36.9 C)  TempSrc:      SpO2: 97% 100% 97% 98%  Weight:      Height:       No data found.   Intake/Output Summary (Last 24 hours) at 03/19/2021 1115 Last data filed at 03/19/2021 1324 Gross per 24 hour  Intake 337.08 ml  Output --  Net 337.08 ml   Filed Weights   03/18/21 1152  Weight: 59 kg    Exam:  GEN: NAD SKIN: Sparse macular lesions on bilateral thighs EYES: EOMI ENT: MMM CV: RRR PULM: CTA B ABD: soft, ND, NT, +BS CNS: AAO x 3, non focal EXT: No edema or tenderness         Data Reviewed:   I have personally reviewed following labs and imaging studies:  Labs: Labs show the following:   Basic Metabolic Panel: Recent Labs  Lab 03/18/21 1155 03/18/21 1242 03/19/21 0541  NA 133*  --  133*  K 3.8  --  3.6  CL 102  --  99  CO2 23  --  25  GLUCOSE 109*  --  101*  BUN 8  --  6  CREATININE 0.55  --  0.60  CALCIUM 8.4*  --  8.3*  MG  --  2.1  --    GFR Estimated Creatinine Clearance: 83.4 mL/min (by C-G formula based on SCr of 0.6 mg/dL). Liver Function Tests: Recent Labs  Lab 03/18/21 1155 03/18/21 1557 03/19/21 0541  AST 734* 687* 512*  ALT 461* 450* 349*  ALKPHOS 187* 182* 137*  BILITOT 2.0* 2.0* 2.1*  PROT 5.9* 5.7* 5.0*  ALBUMIN 3.4* 3.4* 2.9*   Recent Labs  Lab 03/18/21 1155  LIPASE 52*   Recent Labs  Lab 03/18/21 1558  AMMONIA 38*   Coagulation profile Recent Labs  Lab 03/18/21 1242 03/19/21 0541  INR 1.5* 1.5*    CBC: Recent Labs  Lab 03/18/21 1155 03/19/21 0541  WBC 6.6 4.5  HGB 13.7 12.0  HCT 41.0 35.3*  MCV 94.7 93.9  PLT 319 186   Cardiac Enzymes: Recent Labs  Lab 03/18/21 1155  CKTOTAL 62   BNP (last 3 results) No results for input(s): PROBNP in the last 8760 hours. CBG: No results for input(s): GLUCAP in the last 168 hours. D-Dimer: No results for input(s): DDIMER in the last 72 hours. Hgb A1c: No results for input(s): HGBA1C in the last 72 hours. Lipid Profile: No results for input(s): CHOL, HDL, LDLCALC, TRIG, CHOLHDL, LDLDIRECT in the last 72 hours. Thyroid function studies: Recent Labs    03/19/21 0914  TSH 0.939   Anemia work up: Recent Labs    03/19/21 0914  FERRITIN 2,983*  TIBC 162*  IRON 152   Sepsis Labs: Recent Labs  Lab 03/18/21 1154 03/18/21 1155 03/18/21 1745 03/19/21 0541  PROCALCITON 1.11  --   --  1.01  WBC  --  6.6  --  4.5  LATICACIDVEN  --   --  1.3  --     Microbiology Recent Results (from the past 240 hour(s))  Blood culture (routine x 2)      Status: None (Preliminary result)   Collection Time: 03/18/21  3:44 PM   Specimen: BLOOD  Result Value Ref Range Status   Specimen Description BLOOD  RIGHT ANTECUBITAL  Final   Special Requests   Final    BOTTLES DRAWN AEROBIC AND ANAEROBIC Blood Culture results may not be optimal due to an excessive volume of blood received in culture bottles   Culture   Final    NO GROWTH < 12 HOURS Performed at [facility], [address], [place], [place] [address]    Report Status PENDING  Incomplete  Resp Panel by RT-PCR (Flu A&B, Covid) Nasopharyngeal Swab     Status: None   Collection Time: 03/18/21  3:45 PM   Specimen: Nasopharyngeal Swab; Nasopharyngeal(NP) swabs in vial transport medium  Result Value Ref Range Status   SARS Coronavirus 2 by RT PCR NEGATIVE NEGATIVE Final    Comment: (NOTE) SARS-CoV-2 target nucleic acids are NOT DETECTED.  The SARS-CoV-2 RNA is generally detectable in upper respiratory specimens during the acute phase of infection. The lowest concentration of SARS-CoV-2 viral copies this assay can detect is 138 copies/mL. A negative result does not preclude SARS-Cov-2 infection and should not be used as the sole basis for treatment or other patient management decisions. A negative result may occur with  improper specimen collection/handling, submission of specimen other than nasopharyngeal swab, presence of viral mutation(s) within the areas targeted by this assay, and inadequate number of viral copies(<138 copies/mL). A negative result must be combined with clinical observations, patient history, and epidemiological information. The expected result is Negative.  Fact Sheet for Patients:  BloggerCourse.com  Fact Sheet for Healthcare Providers:  SeriousBroker.it  This test is no t yet approved or cleared by the [place] FDA and  has been authorized for detection and/or diagnosis of SARS-CoV-2 by FDA  under an Emergency Use Authorization (EUA). This EUA will remain  in effect (meaning this test can be used) for the duration of the COVID-19 declaration under Section 564(b)(1) of the Act, 21 U.S.C.section 360bbb-3(b)(1), unless the authorization is terminated  or revoked sooner.       Influenza A by PCR NEGATIVE NEGATIVE Final   Influenza B by PCR NEGATIVE NEGATIVE Final    Comment: (NOTE) The Xpert Xpress SARS-CoV-2/FLU/RSV plus assay is intended as an aid in the diagnosis of influenza from Nasopharyngeal swab specimens and should not be used as a sole basis for treatment. Nasal washings and aspirates are unacceptable for Xpert Xpress SARS-CoV-2/FLU/RSV testing.  Fact Sheet for Patients: BloggerCourse.com  Fact Sheet for Healthcare Providers: SeriousBroker.it  This test is not yet approved or cleared by the [place] FDA and has been authorized for detection and/or diagnosis of SARS-CoV-2 by FDA under an Emergency Use Authorization (EUA). This EUA will remain in effect (meaning this test can be used) for the duration of the COVID-19 declaration under Section 564(b)(1) of the Act, 21 U.S.C. section 360bbb-3(b)(1), unless the authorization is terminated or revoked.  Performed at [facility], [address] [place], [place] [address]   Blood culture (routine x 2)     Status: None (Preliminary result)   Collection Time: 03/18/21  3:49 PM   Specimen: BLOOD  Result Value Ref Range Status   Specimen Description BLOOD LEFT ANTECUBITAL  Final   Special Requests   Final    BOTTLES DRAWN AEROBIC AND ANAEROBIC Blood Culture results may not be optimal due to an excessive volume of blood received in culture bottles   Culture   Final    NO GROWTH < 12 HOURS Performed at [facility], [address] [place], [place] [address]    Report  Status PENDING  Incomplete    Procedures and diagnostic studies:  US  ABDOMEN LIMITED RUQ (LIVER/GB)  Result Date: 03/18/2021 CLINICAL DATA:  Abdominal pain EXAM: ULTRASOUND ABDOMEN LIMITED RIGHT UPPER QUADRANT COMPARISON:  Right upper quadrant ultrasound 02/20/2021 FINDINGS: Gallbladder: Distended, similar in appearance to prior exam. No intraluminal gallstones or polyps. Upper normal gallbladder wall thickness of 3-4 mm. Trace pericholecystic fluid. No sonographic Murphy sign noted by sonographer. Common bile duct: Diameter: 6 mm.  No visualized choledocholithiasis. Liver: Diffusely increased parenchymal echogenicity. There is focal fatty sparing adjacent to the gallbladder fossa. No focal hepatic lesion. No intrahepatic biliary ductal dilatation. No capsular nodularity. Portal vein is patent on color Doppler imaging with normal direction of blood flow towards the liver. Other: None. IMPRESSION: 1. Gallbladder distension, similar to prior exam. There is mild gallbladder wall thickening and trace pericholecystic fluid. No gallstones. This may represent acalculous cholecystitis in the appropriate clinical setting. 2. Upper normal common bile duct at 6 mm. 3. Increased hepatic parenchymal echogenicity, typically seen with steatosis. Electronically Signed   By: Narda Rutherford M.D.   On: 03/18/2021 17:10               LOS: 1 day   Carisma Troupe  [facility]   Pager on [email or web address]. If 7PM-7AM, please contact night-coverage at [email or web address]     03/19/2021, 11:15 AM

## 2021-03-20 ENCOUNTER — Inpatient Hospital Stay: Payer: Medicaid Other

## 2021-03-20 LAB — COMPREHENSIVE METABOLIC PANEL
ALT: 254 U/L — ABNORMAL HIGH (ref 0–44)
AST: 264 U/L — ABNORMAL HIGH (ref 15–41)
Albumin: 2.8 g/dL — ABNORMAL LOW (ref 3.5–5.0)
Alkaline Phosphatase: 120 U/L (ref 38–126)
Anion gap: 9 (ref 5–15)
BUN: 7 mg/dL (ref 6–20)
CO2: 25 mmol/L (ref 22–32)
Calcium: 8.5 mg/dL — ABNORMAL LOW (ref 8.9–10.3)
Chloride: 101 mmol/L (ref 98–111)
Creatinine, Ser: 0.65 mg/dL (ref 0.44–1.00)
GFR, Estimated: >60 mL/min (ref >60)
Glucose, Bld: 84 mg/dL (ref 70–99)
Potassium: 4.1 mmol/L (ref 3.5–5.1)
Sodium: 135 mmol/L (ref 135–145)
Total Bilirubin: 2.4 mg/dL — ABNORMAL HIGH (ref 0.3–1.2)
Total Protein: 5.1 g/dL — ABNORMAL LOW (ref 6.5–8.1)

## 2021-03-20 LAB — PROTIME-INR
INR: 1.2 (ref 0.8–1.2)
Prothrombin Time: 15.4 seconds — ABNORMAL HIGH (ref 11.4–15.2)

## 2021-03-20 LAB — PROCALCITONIN: Procalcitonin: 0.87 ng/mL

## 2021-03-20 LAB — HIV ANTIBODY (ROUTINE TESTING W REFLEX): HIV Screen 4th Generation wRfx: NONREACTIVE

## 2021-03-20 MED ORDER — TECHNETIUM TC 99M MEBROFENIN IV KIT
5.0000 | PACK | Freq: Once | INTRAVENOUS | Status: AC | PRN
  Administered 2021-03-20: 7.8 via INTRAVENOUS

## 2021-03-20 MED ORDER — DEXTROSE IN LACTATED RINGERS 5 % IV SOLN: INTRAVENOUS | Status: DC

## 2021-03-20 NOTE — Progress Notes (Signed)
Jonathon Bellows , MD [address], [place], [place], [place], [address] [address], [place], [place], [place], [address] Phone: [phone]  Fax: [phone]   Gina Price is being followed for alcoholic hepatitis  Day 1 of follow up   Subjective: Feels much better, no new complaints   Objective: Vital signs in last 24 hours: Vitals:   03/20/21 0115 03/20/21 0200 03/20/21 0306 03/20/21 0735  BP: 123/81 123/81 115/79 (!) 129/93  Pulse: 99 70 76 76  Resp:   15 15  Temp: 98.1 F (36.7 C)  98.5 F (36.9 C) 97.8 F (36.6 C)  TempSrc: Oral     SpO2: 99%  99% 100%  Weight:      Height:       Weight change:   Intake/Output Summary (Last 24 hours) at 03/20/2021 0935 Last data filed at 03/20/2021 0300 Gross per 24 hour  Intake 622.36 ml  Output --  Net 622.36 ml     Exam:  Abdomen: soft, nontender, normal bowel sounds   Lab Results: @LABTEST2 @ Micro Results: Recent Results (from the past 240 hour(s))  Blood culture (routine x 2)     Status: None (Preliminary result)   Collection Time: 03/18/21  3:44 PM   Specimen: BLOOD  Result Value Ref Range Status   Specimen Description BLOOD RIGHT ANTECUBITAL  Final   Special Requests   Final    BOTTLES DRAWN AEROBIC AND ANAEROBIC Blood Culture results may not be optimal due to an excessive volume of blood received in culture bottles   Culture   Final    NO GROWTH 2 DAYS Performed at [facility], [address] [place], [place] [address]    Report Status PENDING  Incomplete  Resp Panel by RT-PCR (Flu A&B, Covid) Nasopharyngeal Swab     Status: None   Collection Time: 03/18/21  3:45 PM   Specimen: Nasopharyngeal Swab; Nasopharyngeal(NP) swabs in vial transport medium  Result Value Ref Range Status   SARS Coronavirus 2 by RT PCR NEGATIVE NEGATIVE Final    Comment: (NOTE) SARS-CoV-2 target nucleic acids are NOT DETECTED.  The SARS-CoV-2 RNA is generally detectable in upper respiratory specimens during the  acute phase of infection. The lowest concentration of SARS-CoV-2 viral copies this assay can detect is 138 copies/mL. A negative result does not preclude SARS-Cov-2 infection and should not be used as the sole basis for treatment or other patient management decisions. A negative result may occur with  improper specimen collection/handling, submission of specimen other than nasopharyngeal swab, presence of viral mutation(s) within the areas targeted by this assay, and inadequate number of viral copies(<138 copies/mL). A negative result must be combined with clinical observations, patient history, and epidemiological information. The expected result is Negative.  Fact Sheet for Patients:  EntrepreneurPulse.com.au  Fact Sheet for Healthcare Providers:  IncredibleEmployment.be  This test is no t yet approved or cleared by the [place] FDA and  has been authorized for detection and/or diagnosis of SARS-CoV-2 by FDA under an Emergency Use Authorization (EUA). This EUA will remain  in effect (meaning this test can be used) for the duration of the COVID-19 declaration under Section 564(b)(1) of the Act, 21 U.S.C.section 360bbb-3(b)(1), unless the authorization is terminated  or revoked sooner.       Influenza A by PCR NEGATIVE NEGATIVE Final   Influenza B by PCR NEGATIVE NEGATIVE Final    Comment: (NOTE) The Xpert Xpress SARS-CoV-2/FLU/RSV plus assay is intended as an aid in the diagnosis  of influenza from Nasopharyngeal swab specimens and should not be used as a sole basis for treatment. Nasal washings and aspirates are unacceptable for Xpert Xpress SARS-CoV-2/FLU/RSV testing.  Fact Sheet for Patients: EntrepreneurPulse.com.au  Fact Sheet for Healthcare Providers: IncredibleEmployment.be  This test is not yet approved or cleared by the [place] FDA and has been authorized for detection and/or  diagnosis of SARS-CoV-2 by FDA under an Emergency Use Authorization (EUA). This EUA will remain in effect (meaning this test can be used) for the duration of the COVID-19 declaration under Section 564(b)(1) of the Act, 21 U.S.C. section 360bbb-3(b)(1), unless the authorization is terminated or revoked.  Performed at [facility], [address] [place], [place] [address]   Blood culture (routine x 2)     Status: None (Preliminary result)   Collection Time: 03/18/21  3:49 PM   Specimen: BLOOD  Result Value Ref Range Status   Specimen Description BLOOD LEFT ANTECUBITAL  Final   Special Requests   Final    BOTTLES DRAWN AEROBIC AND ANAEROBIC Blood Culture results may not be optimal due to an excessive volume of blood received in culture bottles   Culture   Final    NO GROWTH 2 DAYS Performed at [facility], [address] [place], [place] [address]    Report Status PENDING  Incomplete   Studies/Results: US LIVER DOPPLER  Result Date: 03/20/2021 CLINICAL DATA:  31 year old female with liver failure. EXAM: DUPLEX ULTRASOUND OF LIVER TECHNIQUE: Color and duplex Doppler ultrasound was performed to evaluate the hepatic in-flow and out-flow vessels. COMPARISON:  03/18/2021 FINDINGS: Liver: Increased echogenicity in the liver with poor visualization of the internal architecture. Findings are suggestive for steatosis. No intrahepatic biliary dilatation. Normal hepatic contour without nodularity. No focal liver lesion. Main Portal Vein size: 0.8 cm Portal Vein Velocities Main Prox:  30 cm/sec Main Mid: 34 cm/sec Main Dist:  30 cm/sec Right: 22 cm/sec Left: 19 cm/sec Hepatic Vein Velocities Right:  31 cm/sec Middle:  30 cm/sec Left:  24 cm/sec IVC: Present and patent with normal respiratory phasicity. Hepatic Artery Velocity:  135 cm/sec Splenic Vein Velocity:  11 cm/sec Spleen: 9.3 cm x 10.3 cm x 3.5 cm with a total volume of 168 cm^3 (411 cm^3 is upper limit normal) Portal Vein  Occlusion/Thrombus: No Splenic Vein Occlusion/Thrombus: No Ascites: None Varices: None Normal hepatopetal flow in the portal veins. Normal hepatofugal flow in the hepatic veins. IMPRESSION: 1. Portal venous system is patent with normal direction of flow. 2. Increased echogenicity in the hepatic parenchyma. Findings are suggestive for steatosis. Electronically Signed   By: Markus Daft M.D.   On: 03/20/2021 09:27   US ABDOMEN LIMITED RUQ (LIVER/GB)  Result Date: 03/18/2021 CLINICAL DATA:  Abdominal pain EXAM: ULTRASOUND ABDOMEN LIMITED RIGHT UPPER QUADRANT COMPARISON:  Right upper quadrant ultrasound 02/20/2021 FINDINGS: Gallbladder: Distended, similar in appearance to prior exam. No intraluminal gallstones or polyps. Upper normal gallbladder wall thickness of 3-4 mm. Trace pericholecystic fluid. No sonographic Murphy sign noted by sonographer. Common bile duct: Diameter: 6 mm.  No visualized choledocholithiasis. Liver: Diffusely increased parenchymal echogenicity. There is focal fatty sparing adjacent to the gallbladder fossa. No focal hepatic lesion. No intrahepatic biliary ductal dilatation. No capsular nodularity. Portal vein is patent on color Doppler imaging with normal direction of blood flow towards the liver. Other: None. IMPRESSION: 1. Gallbladder distension, similar to prior exam. There is mild gallbladder wall thickening and trace pericholecystic fluid. No gallstones. This may represent acalculous cholecystitis in  the appropriate clinical setting. 2. Upper normal common bile duct at 6 mm. 3. Increased hepatic parenchymal echogenicity, typically seen with steatosis. Electronically Signed   By: Keith Rake M.D.   On: 03/18/2021 17:10   Medications: I have reviewed the patient's current medications. Scheduled Meds:  amphetamine-dextroamphetamine  25 mg Oral q morning   dicyclomine  20 mg Oral TID AC & HS   folic acid  1 mg Oral Daily   linaclotide  72 mcg Oral Daily   lisinopril  5 mg Oral q  morning   lithium  600 mg Oral QHS   multivitamin with minerals  1 tablet Oral Daily   thiamine  100 mg Oral Daily   Or   thiamine  100 mg Intravenous Daily   Continuous Infusions:  acetylcysteine 15 mg/kg/hr (03/19/21 1500)   phytonadione (VITAMIN K) IV Stopped (03/19/21 1135)   PRN Meds:.ibuprofen, LORazepam **OR** LORazepam, ondansetron (ZOFRAN) IV   Assessment: Principal Problem:   Hepatitis Active Problems:   Alcoholic hepatitis  Gina Price is a 31 y.o. y/o female with a history of alcohol abuse and possible excess Tylenol intake presents to emergency room with acute liver failure with INR greater than 1.5.   AST and ALT are typically less than [id number] and alcoholic hepatitis which goes a bit against the diagnosis in this patient.  Is possible she has a coexisting alcoholic hepatitis as well as Tylenol toxicity as there was some concern on admission for the same.  After treatment with NAC and supportive care her INR today is 1.2.  No evidence of portal vein thrombosis on liver Doppler     Plan 1.  Monitor daily INR.  Rising INR is suggestive of liver failure.  Presently it is improving.   2.  Follow-up full viral hepatitis and autoimmune panel as an outpatient  3.  Complete alcohol cessation and would recommend outpatient enrollment with alcoholic Anonymous or rehab.  4.  Discriminant function is 18 and hence does not qualify for steroids in the setting of alcoholic hepatitis.  Once eating and drinking , completed NAC protocol, can plan for discharge t from the GI point of view      LOS: 2 days   Jonathon Bellows, MD 03/20/2021, 9:35 AM

## 2021-03-20 NOTE — TOC Progression Note (Signed)
Transition of Care [facility]) - Progression Note    Patient Details  Name: Serenitie Vinton MRN: [id number] Date of Birth: 03/23/1990  Transition of Care [facility]) CM/SW Contact  Ashley Royalty Lutricia Feil, RN Phone Number:[phone] 03/20/2021, 2:23 PM  Clinical Narrative:    Requested order for Substance Abuse Counseling. Attempted to consult and obtained the initial assessment however pt off the floor. Information on Substance Abuse resources placed into pt's discharged chart for late review and education to be provided. Will continue outreach attempts for counseling if not available will request ongoing TOC staff to educate accordingly.  TOC team remains available to assist with ongoing discharge needs.      Expected Discharge Plan and Services                                                 Social Determinants of Health (SDOH) Interventions    Readmission Risk Interventions No flowsheet data found.

## 2021-03-20 NOTE — Progress Notes (Signed)
Progress Note    Gina Price  AOZ:[id number] DOB: 1990-10-19  DOA: 03/18/2021 PCP: System, Provider Not In      Brief Narrative:    Medical records reviewed and are as summarized below:  Gina Price is a 31 y.o. female with medical history significant for alcohol use disorder, ADHD, hypertension, hyperlipidemia, anxiety, depression, IBS who presented to the hospital because of generalized body pains, abdominal pain and jaundice.  She also had an itchy rash on her arms and legs but this has improved.  She was seen in the emergency room about 3 weeks prior to admission and had ultrasound showed fatty liver.  She was advised to see a gastroenterologist in the outpatient setting at that time.      Assessment/Plan:   Principal Problem:   Hepatitis Active Problems:   Alcoholic hepatitis   Body mass index is 24.58 kg/m.   Acute hepatitis, likely alcoholic hepatitis with coagulopathy and hypoalbuminemia: S/p IV vitamin K.  No indication for steroids at this time.  INR is down from 1.5-1.2.  Liver enzymes are trending downward.  Hepatitis a and B were nonreactive.  Serologic work-up for hepatitis is pending.  Liver ultrasound was concerning for acalculus cholecystitis.  HIDA scan is pending.  Follow-up with gastroenterologist and general surgeon.    General body aches: Likely from acute hepatitis.  Analgesics as needed for pain.  Alcohol use disorder: She is on benzodiazepines as needed per CIWA protocol  Bipolar disorder, anxiety, depression: Continue lithium and buspirone  Of note, patient said she has not taken zonisamide for many years.  She said it was prescribed for migraine/tinnitus and she probably took it only twice and has not taken it since.  She has no history of seizures.   Diet Order             Diet NPO time specified  Diet effective midnight                    Consultants: Gastroenterologist  Procedures: None    Medications:     amphetamine-dextroamphetamine  25 mg Oral q morning   dicyclomine  20 mg Oral TID AC & HS   folic acid  1 mg Oral Daily   linaclotide  72 mcg Oral Daily   lisinopril  5 mg Oral q morning   lithium  600 mg Oral QHS   multivitamin with minerals  1 tablet Oral Daily   thiamine  100 mg Oral Daily   Or   thiamine  100 mg Intravenous Daily   Continuous Infusions:  acetylcysteine 15 mg/kg/hr (03/19/21 1500)   dextrose 5% lactated ringers 75 mL/hr at 03/20/21 1131     Anti-infectives (From admission, onward)    None              Family Communication/Anticipated D/C date and plan/Code Status   DVT prophylaxis: SCDs Start: 03/18/21 1720     Code Status: Full Code  Family Communication: None Disposition Plan: Plan to discharge home in 1-2 days   Status is: Inpatient Remains inpatient appropriate because: Acute hepatitis pending further work-up               Subjective:   C/o epigastric and right-sided abdominal pain.  She is hungry and wants something to eat  Objective:    Vitals:   03/20/21 0306 03/20/21 0735 03/20/21 0800 03/20/21 1233  BP: 115/79 (!) 129/93 (!) 129/93 (!) 132/94  Pulse: 76 76 76 74  Resp:  15 15  17   Temp: 98.5 F (36.9 C) 97.8 F (36.6 C)  98.6 F (37 C)  TempSrc:      SpO2: 99% 100%    Weight:      Height:       No data found.   Intake/Output Summary (Last 24 hours) at 03/20/2021 1401 Last data filed at 03/20/2021 0300 Gross per 24 hour  Intake 622.36 ml  Output --  Net 622.36 ml   Filed Weights   03/18/21 1152  Weight: 59 kg    Exam:  GEN: NAD SKIN: Sparse macular lesions on b/l lower extremities EYES: EOMI ENT: MMM CV: RRR PULM: CTA B ABD: soft, ND, NT, +BS CNS: AAO x 3, non focal EXT: No edema or tenderness         Data Reviewed:   I have personally reviewed following labs and imaging studies:  Labs: Labs show the following:   Basic Metabolic Panel: Recent Labs  Lab 03/18/21 1155  03/18/21 1242 03/19/21 0541 03/20/21 0649  NA 133*  --  133* 135  K 3.8  --  3.6 4.1  CL 102  --  99 101  CO2 23  --  25 25  GLUCOSE 109*  --  101* 84  BUN 8  --  6 7  CREATININE 0.55  --  0.60 0.65  CALCIUM 8.4*  --  8.3* 8.5*  MG  --  2.1  --   --    GFR Estimated Creatinine Clearance: 83.4 mL/min (by C-G formula based on SCr of 0.65 mg/dL). Liver Function Tests: Recent Labs  Lab 03/18/21 1155 03/18/21 1557 03/19/21 0541 03/20/21 0649  AST 734* 687* 512* 264*  ALT 461* 450* 349* 254*  ALKPHOS 187* 182* 137* 120  BILITOT 2.0* 2.0* 2.1* 2.4*  PROT 5.9* 5.7* 5.0* 5.1*  ALBUMIN 3.4* 3.4* 2.9* 2.8*   Recent Labs  Lab 03/18/21 1155  LIPASE 52*   Recent Labs  Lab 03/18/21 1558  AMMONIA 38*   Coagulation profile Recent Labs  Lab 03/18/21 1242 03/19/21 0541 03/20/21 0805  INR 1.5* 1.5* 1.2    CBC: Recent Labs  Lab 03/18/21 1155 03/19/21 0541  WBC 6.6 4.5  HGB 13.7 12.0  HCT 41.0 35.3*  MCV 94.7 93.9  PLT 319 186   Cardiac Enzymes: Recent Labs  Lab 03/18/21 1155  CKTOTAL 62   BNP (last 3 results) No results for input(s): PROBNP in the last 8760 hours. CBG: No results for input(s): GLUCAP in the last 168 hours. D-Dimer: No results for input(s): DDIMER in the last 72 hours. Hgb A1c: No results for input(s): HGBA1C in the last 72 hours. Lipid Profile: No results for input(s): CHOL, HDL, LDLCALC, TRIG, CHOLHDL, LDLDIRECT in the last 72 hours. Thyroid function studies: Recent Labs    03/19/21 0914  TSH 0.939   Anemia work up: Recent Labs    03/19/21 0914  FERRITIN 2,983*  TIBC 162*  IRON 152   Sepsis Labs: Recent Labs  Lab 03/18/21 1154 03/18/21 1155 03/18/21 1745 03/19/21 0541 03/20/21 0649  PROCALCITON 1.11  --   --  1.01 0.87  WBC  --  6.6  --  4.5  --   LATICACIDVEN  --   --  1.3  --   --     Microbiology Recent Results (from the past 240 hour(s))  Blood culture (routine x 2)     Status: None (Preliminary result)    Collection Time: 03/18/21  3:44 PM  Specimen: BLOOD  Result Value Ref Range Status   Specimen Description BLOOD RIGHT ANTECUBITAL  Final   Special Requests   Final    BOTTLES DRAWN AEROBIC AND ANAEROBIC Blood Culture results may not be optimal due to an excessive volume of blood received in culture bottles   Culture   Final    NO GROWTH 2 DAYS Performed at [facility], [address], [place], [place] [address]    Report Status PENDING  Incomplete  Resp Panel by RT-PCR (Flu A&B, Covid) Nasopharyngeal Swab     Status: None   Collection Time: 03/18/21  3:45 PM   Specimen: Nasopharyngeal Swab; Nasopharyngeal(NP) swabs in vial transport medium  Result Value Ref Range Status   SARS Coronavirus 2 by RT PCR NEGATIVE NEGATIVE Final    Comment: (NOTE) SARS-CoV-2 target nucleic acids are NOT DETECTED.  The SARS-CoV-2 RNA is generally detectable in upper respiratory specimens during the acute phase of infection. The lowest concentration of SARS-CoV-2 viral copies this assay can detect is 138 copies/mL. A negative result does not preclude SARS-Cov-2 infection and should not be used as the sole basis for treatment or other patient management decisions. A negative result may occur with  improper specimen collection/handling, submission of specimen other than nasopharyngeal swab, presence of viral mutation(s) within the areas targeted by this assay, and inadequate number of viral copies(<138 copies/mL). A negative result must be combined with clinical observations, patient history, and epidemiological information. The expected result is Negative.  Fact Sheet for Patients:  BloggerCourse.com  Fact Sheet for Healthcare Providers:  SeriousBroker.it  This test is no t yet approved or cleared by the [place] FDA and  has been authorized for detection and/or diagnosis of SARS-CoV-2 by FDA under an Emergency Use Authorization (EUA).  This EUA will remain  in effect (meaning this test can be used) for the duration of the COVID-19 declaration under Section 564(b)(1) of the Act, 21 U.S.C.section 360bbb-3(b)(1), unless the authorization is terminated  or revoked sooner.       Influenza A by PCR NEGATIVE NEGATIVE Final   Influenza B by PCR NEGATIVE NEGATIVE Final    Comment: (NOTE) The Xpert Xpress SARS-CoV-2/FLU/RSV plus assay is intended as an aid in the diagnosis of influenza from Nasopharyngeal swab specimens and should not be used as a sole basis for treatment. Nasal washings and aspirates are unacceptable for Xpert Xpress SARS-CoV-2/FLU/RSV testing.  Fact Sheet for Patients: BloggerCourse.com  Fact Sheet for Healthcare Providers: SeriousBroker.it  This test is not yet approved or cleared by the [place] FDA and has been authorized for detection and/or diagnosis of SARS-CoV-2 by FDA under an Emergency Use Authorization (EUA). This EUA will remain in effect (meaning this test can be used) for the duration of the COVID-19 declaration under Section 564(b)(1) of the Act, 21 U.S.C. section 360bbb-3(b)(1), unless the authorization is terminated or revoked.  Performed at [facility], [address] [place], [place] [address]   Blood culture (routine x 2)     Status: None (Preliminary result)   Collection Time: 03/18/21  3:49 PM   Specimen: BLOOD  Result Value Ref Range Status   Specimen Description BLOOD LEFT ANTECUBITAL  Final   Special Requests   Final    BOTTLES DRAWN AEROBIC AND ANAEROBIC Blood Culture results may not be optimal due to an excessive volume of blood received in culture bottles   Culture   Final    NO GROWTH 2 DAYS Performed at [facility],  [address] [place], [place] [address]    Report Status PENDING  Incomplete    Procedures and diagnostic studies:  US LIVER DOPPLER  Result Date: 03/20/2021 CLINICAL  DATA:  31 year old female with liver failure. EXAM: DUPLEX ULTRASOUND OF LIVER TECHNIQUE: Color and duplex Doppler ultrasound was performed to evaluate the hepatic in-flow and out-flow vessels. COMPARISON:  03/18/2021 FINDINGS: Liver: Increased echogenicity in the liver with poor visualization of the internal architecture. Findings are suggestive for steatosis. No intrahepatic biliary dilatation. Normal hepatic contour without nodularity. No focal liver lesion. Main Portal Vein size: 0.8 cm Portal Vein Velocities Main Prox:  30 cm/sec Main Mid: 34 cm/sec Main Dist:  30 cm/sec Right: 22 cm/sec Left: 19 cm/sec Hepatic Vein Velocities Right:  31 cm/sec Middle:  30 cm/sec Left:  24 cm/sec IVC: Present and patent with normal respiratory phasicity. Hepatic Artery Velocity:  135 cm/sec Splenic Vein Velocity:  11 cm/sec Spleen: 9.3 cm x 10.3 cm x 3.5 cm with a total volume of 168 cm^3 (411 cm^3 is upper limit normal) Portal Vein Occlusion/Thrombus: No Splenic Vein Occlusion/Thrombus: No Ascites: None Varices: None Normal hepatopetal flow in the portal veins. Normal hepatofugal flow in the hepatic veins. IMPRESSION: 1. Portal venous system is patent with normal direction of flow. 2. Increased echogenicity in the hepatic parenchyma. Findings are suggestive for steatosis. Electronically Signed   By: Richarda Overlie M.D.   On: 03/20/2021 09:27   US ABDOMEN LIMITED RUQ (LIVER/GB)  Result Date: 03/18/2021 CLINICAL DATA:  Abdominal pain EXAM: ULTRASOUND ABDOMEN LIMITED RIGHT UPPER QUADRANT COMPARISON:  Right upper quadrant ultrasound 02/20/2021 FINDINGS: Gallbladder: Distended, similar in appearance to prior exam. No intraluminal gallstones or polyps. Upper normal gallbladder wall thickness of 3-4 mm. Trace pericholecystic fluid. No sonographic Murphy sign noted by sonographer. Common bile duct: Diameter: 6 mm.  No visualized choledocholithiasis. Liver: Diffusely increased parenchymal echogenicity. There is focal fatty sparing  adjacent to the gallbladder fossa. No focal hepatic lesion. No intrahepatic biliary ductal dilatation. No capsular nodularity. Portal vein is patent on color Doppler imaging with normal direction of blood flow towards the liver. Other: None. IMPRESSION: 1. Gallbladder distension, similar to prior exam. There is mild gallbladder wall thickening and trace pericholecystic fluid. No gallstones. This may represent acalculous cholecystitis in the appropriate clinical setting. 2. Upper normal common bile duct at 6 mm. 3. Increased hepatic parenchymal echogenicity, typically seen with steatosis. Electronically Signed   By: Narda Rutherford M.D.   On: 03/18/2021 17:10               LOS: 2 days   Gina Price  [facility]   Pager on [email or web address]. If 7PM-7AM, please contact night-coverage at [email or web address]     03/20/2021, 2:01 PM

## 2021-03-21 LAB — PROTIME-INR
INR: 1.2 (ref 0.8–1.2)
Prothrombin Time: 14.9 seconds (ref 11.4–15.2)

## 2021-03-21 LAB — COMPREHENSIVE METABOLIC PANEL
ALT: 187 U/L — ABNORMAL HIGH (ref 0–44)
AST: 137 U/L — ABNORMAL HIGH (ref 15–41)
Albumin: 2.8 g/dL — ABNORMAL LOW (ref 3.5–5.0)
Alkaline Phosphatase: 113 U/L (ref 38–126)
Anion gap: 6 (ref 5–15)
BUN: 7 mg/dL (ref 6–20)
CO2: 25 mmol/L (ref 22–32)
Calcium: 8.6 mg/dL — ABNORMAL LOW (ref 8.9–10.3)
Chloride: 106 mmol/L (ref 98–111)
Creatinine, Ser: 0.63 mg/dL (ref 0.44–1.00)
GFR, Estimated: >60 mL/min (ref >60)
Glucose, Bld: 96 mg/dL (ref 70–99)
Potassium: 3.6 mmol/L (ref 3.5–5.1)
Sodium: 137 mmol/L (ref 135–145)
Total Bilirubin: 1.6 mg/dL — ABNORMAL HIGH (ref 0.3–1.2)
Total Protein: 5.4 g/dL — ABNORMAL LOW (ref 6.5–8.1)

## 2021-03-21 LAB — MITOCHONDRIAL ANTIBODIES: Mitochondrial M2 Ab, IgG: <20 Units (ref 0.0–20.0)

## 2021-03-21 LAB — CERULOPLASMIN: Ceruloplasmin: 6.7 mg/dL — ABNORMAL LOW (ref 19.0–39.0)

## 2021-03-21 LAB — HCV RNA QUANT: HCV Quantitative: NOT DETECTED IU/mL (ref >50)

## 2021-03-21 LAB — HEPATITIS B E ANTIGEN: Hep B E Ag: NEGATIVE

## 2021-03-21 NOTE — Discharge Summary (Addendum)
Physician Discharge Summary  Gina Price JYN:[id number] DOB: [birth year] DOA: 03/18/2021  PCP: System, Provider Not In  Admit date: 03/18/2021 Discharge date: 03/21/2021  Discharge disposition: Home   Recommendations for Outpatient Follow-Up:   Follow up with Dr. Tobi Bastos, gastroenterologist, within 2 weeks of discharge   Discharge Diagnosis:   Principal Problem:   Hepatitis Active Problems:   Alcoholic hepatitis    Discharge Condition: Stable.  Diet recommendation:  Diet Order             Diet - low sodium heart healthy           Diet regular Room service appropriate? Yes; Fluid consistency: Thin  Diet effective now                     Code Status: Full Code     Hospital Course:   Gina Price is a 31 y.o. female with medical history significant for alcohol use disorder, ADHD, hypertension, hyperlipidemia, anxiety, depression, IBS who presented to the hospital because of generalized body pains, abdominal pain and jaundice.  She also had an itchy rash on her arms and legs but this has improved.  She was seen in the emergency room about 3 weeks prior to admission and had ultrasound that showed fatty liver.  She was advised to see a gastroenterologist in the outpatient setting at that time.   She was admitted to the hospital for alcoholic hepatitis with coagulopathy.  She was treated with analgesics and vitamin K.  She was also treated with N-acetylcysteine because of concern for Tylenol toxicity.  Acute cholecystitis was ruled out with a HIDA scan.  She was given Ativan to prevent alcohol withdrawal syndrome.  Her condition has improved and she is deemed stable for discharge to home today.  She has been counseled to avoid alcohol.   Medical Consultants:   Gastroenterologist   Discharge Exam:    Vitals:   03/21/21 0054 03/21/21 0314 03/21/21 0512 03/21/21 0758  BP: 124/76 121/82 124/74 (!) 133/91  Pulse: 88 73 80 100  Resp:  16  16  Temp:  98.2 F (36.8  C)  98.6 F (37 C)  TempSrc:      SpO2:  100%  99%  Weight:      Height:         GEN: NAD SKIN: No rash EYES: EOMI ENT: MMM CV: RRR PULM: CTA B ABD: soft, ND, NT, +BS CNS: AAO x 3, non focal EXT: No edema or tenderness   The results of significant diagnostics from this hospitalization (including imaging, microbiology, ancillary and laboratory) are listed below for reference.     Procedures and Diagnostic Studies:   US LIVER DOPPLER  Result Date: 03/20/2021 CLINICAL DATA:  31 year old female with liver failure. EXAM: DUPLEX ULTRASOUND OF LIVER TECHNIQUE: Color and duplex Doppler ultrasound was performed to evaluate the hepatic in-flow and out-flow vessels. COMPARISON:  03/18/2021 FINDINGS: Liver: Increased echogenicity in the liver with poor visualization of the internal architecture. Findings are suggestive for steatosis. No intrahepatic biliary dilatation. Normal hepatic contour without nodularity. No focal liver lesion. Main Portal Vein size: 0.8 cm Portal Vein Velocities Main Prox:  30 cm/sec Main Mid: 34 cm/sec Main Dist:  30 cm/sec Right: 22 cm/sec Left: 19 cm/sec Hepatic Vein Velocities Right:  31 cm/sec Middle:  30 cm/sec Left:  24 cm/sec IVC: Present and patent with normal respiratory phasicity. Hepatic Artery Velocity:  135 cm/sec Splenic Vein Velocity:  11 cm/sec Spleen: 9.3 cm  x 10.3 cm x 3.5 cm with a total volume of 168 cm^3 (411 cm^3 is upper limit normal) Portal Vein Occlusion/Thrombus: No Splenic Vein Occlusion/Thrombus: No Ascites: None Varices: None Normal hepatopetal flow in the portal veins. Normal hepatofugal flow in the hepatic veins. IMPRESSION: 1. Portal venous system is patent with normal direction of flow. 2. Increased echogenicity in the hepatic parenchyma. Findings are suggestive for steatosis. Electronically Signed   By: Richarda Overlie M.D.   On: 03/20/2021 09:27   US ABDOMEN LIMITED RUQ (LIVER/GB)  Result Date: 03/18/2021 CLINICAL DATA:  Abdominal pain EXAM:  ULTRASOUND ABDOMEN LIMITED RIGHT UPPER QUADRANT COMPARISON:  Right upper quadrant ultrasound 02/20/2021 FINDINGS: Gallbladder: Distended, similar in appearance to prior exam. No intraluminal gallstones or polyps. Upper normal gallbladder wall thickness of 3-4 mm. Trace pericholecystic fluid. No sonographic Murphy sign noted by sonographer. Common bile duct: Diameter: 6 mm.  No visualized choledocholithiasis. Liver: Diffusely increased parenchymal echogenicity. There is focal fatty sparing adjacent to the gallbladder fossa. No focal hepatic lesion. No intrahepatic biliary ductal dilatation. No capsular nodularity. Portal vein is patent on color Doppler imaging with normal direction of blood flow towards the liver. Other: None. IMPRESSION: 1. Gallbladder distension, similar to prior exam. There is mild gallbladder wall thickening and trace pericholecystic fluid. No gallstones. This may represent acalculous cholecystitis in the appropriate clinical setting. 2. Upper normal common bile duct at 6 mm. 3. Increased hepatic parenchymal echogenicity, typically seen with steatosis. Electronically Signed   By: Narda Rutherford M.D.   On: 03/18/2021 17:10     Labs:   Basic Metabolic Panel: Recent Labs  Lab 03/18/21 1155 03/18/21 1242 03/19/21 0541 03/20/21 0649 03/21/21 0806  NA 133*  --  133* 135 137  K 3.8  --  3.6 4.1 3.6  CL 102  --  99 101 106  CO2 23  --  25 25 25   GLUCOSE 109*  --  101* 84 96  BUN 8  --  6 7 7   CREATININE 0.55  --  0.60 0.65 0.63  CALCIUM 8.4*  --  8.3* 8.5* 8.6*  MG  --  2.1  --   --   --    GFR Estimated Creatinine Clearance: 83.4 mL/min (by C-G formula based on SCr of 0.63 mg/dL). Liver Function Tests: Recent Labs  Lab 03/18/21 1155 03/18/21 1557 03/19/21 0541 03/20/21 0649 03/21/21 0806  AST 734* 687* 512* 264* 137*  ALT 461* 450* 349* 254* 187*  ALKPHOS 187* 182* 137* 120 113  BILITOT 2.0* 2.0* 2.1* 2.4* 1.6*  PROT 5.9* 5.7* 5.0* 5.1* 5.4*  ALBUMIN 3.4* 3.4*  2.9* 2.8* 2.8*   Recent Labs  Lab 03/18/21 1155  LIPASE 52*   Recent Labs  Lab 03/18/21 1558  AMMONIA 38*   Coagulation profile Recent Labs  Lab 03/18/21 1242 03/19/21 0541 03/20/21 0805 03/21/21 0806  INR 1.5* 1.5* 1.2 1.2    CBC: Recent Labs  Lab 03/18/21 1155 03/19/21 0541  WBC 6.6 4.5  HGB 13.7 12.0  HCT 41.0 35.3*  MCV 94.7 93.9  PLT 319 186   Cardiac Enzymes: Recent Labs  Lab 03/18/21 1155  CKTOTAL 62   BNP: Invalid input(s): POCBNP CBG: No results for input(s): GLUCAP in the last 168 hours. D-Dimer No results for input(s): DDIMER in the last 72 hours. Hgb A1c No results for input(s): HGBA1C in the last 72 hours. Lipid Profile No results for input(s): CHOL, HDL, LDLCALC, TRIG, CHOLHDL, LDLDIRECT in the last 72 hours. Thyroid function  studies Recent Labs    03/19/21 0914  TSH 0.939   Anemia work up Recent Labs    03/19/21 0914  FERRITIN 2,983*  TIBC 162*  IRON 152   Microbiology Recent Results (from the past 240 hour(s))  Blood culture (routine x 2)     Status: None (Preliminary result)   Collection Time: 03/18/21  3:44 PM   Specimen: BLOOD  Result Value Ref Range Status   Specimen Description BLOOD RIGHT ANTECUBITAL  Final   Special Requests   Final    BOTTLES DRAWN AEROBIC AND ANAEROBIC Blood Culture results may not be optimal due to an excessive volume of blood received in culture bottles   Culture   Final    NO GROWTH 3 DAYS Performed at [facility], [address], [place], [place] [address]    Report Status PENDING  Incomplete  Resp Panel by RT-PCR (Flu A&B, Covid) Nasopharyngeal Swab     Status: None   Collection Time: 03/18/21  3:45 PM   Specimen: Nasopharyngeal Swab; Nasopharyngeal(NP) swabs in vial transport medium  Result Value Ref Range Status   SARS Coronavirus 2 by RT PCR NEGATIVE NEGATIVE Final    Comment: (NOTE) SARS-CoV-2 target nucleic acids are NOT DETECTED.  The SARS-CoV-2 RNA is generally  detectable in upper respiratory specimens during the acute phase of infection. The lowest concentration of SARS-CoV-2 viral copies this assay can detect is 138 copies/mL. A negative result does not preclude SARS-Cov-2 infection and should not be used as the sole basis for treatment or other patient management decisions. A negative result may occur with  improper specimen collection/handling, submission of specimen other than nasopharyngeal swab, presence of viral mutation(s) within the areas targeted by this assay, and inadequate number of viral copies(<138 copies/mL). A negative result must be combined with clinical observations, patient history, and epidemiological information. The expected result is Negative.  Fact Sheet for Patients:  BloggerCourse.com  Fact Sheet for Healthcare Providers:  SeriousBroker.it  This test is no t yet approved or cleared by the [place] FDA and  has been authorized for detection and/or diagnosis of SARS-CoV-2 by FDA under an Emergency Use Authorization (EUA). This EUA will remain  in effect (meaning this test can be used) for the duration of the COVID-19 declaration under Section 564(b)(1) of the Act, 21 U.S.C.section 360bbb-3(b)(1), unless the authorization is terminated  or revoked sooner.       Influenza A by PCR NEGATIVE NEGATIVE Final   Influenza B by PCR NEGATIVE NEGATIVE Final    Comment: (NOTE) The Xpert Xpress SARS-CoV-2/FLU/RSV plus assay is intended as an aid in the diagnosis of influenza from Nasopharyngeal swab specimens and should not be used as a sole basis for treatment. Nasal washings and aspirates are unacceptable for Xpert Xpress SARS-CoV-2/FLU/RSV testing.  Fact Sheet for Patients: BloggerCourse.com  Fact Sheet for Healthcare Providers: SeriousBroker.it  This test is not yet approved or cleared by the [place] FDA  and has been authorized for detection and/or diagnosis of SARS-CoV-2 by FDA under an Emergency Use Authorization (EUA). This EUA will remain in effect (meaning this test can be used) for the duration of the COVID-19 declaration under Section 564(b)(1) of the Act, 21 U.S.C. section 360bbb-3(b)(1), unless the authorization is terminated or revoked.  Performed at [facility], [address] [place], [place] [address]   Blood culture (routine x 2)     Status: None (Preliminary result)   Collection Time: 03/18/21  3:49 PM   Specimen:  BLOOD  Result Value Ref Range Status   Specimen Description BLOOD LEFT ANTECUBITAL  Final   Special Requests   Final    BOTTLES DRAWN AEROBIC AND ANAEROBIC Blood Culture results may not be optimal due to an excessive volume of blood received in culture bottles   Culture   Final    NO GROWTH 3 DAYS Performed at [facility], [address], [place], [place] [address]    Report Status PENDING  Incomplete     Discharge Instructions:   Discharge Instructions     Diet - low sodium heart healthy   Complete by: As directed    Discharge instructions   Complete by: As directed    Avoid alcohol   Increase activity slowly   Complete by: As directed       Allergies as of 03/21/2021       Reactions   Albuterol Shortness Of Breath, Anaphylaxis   Penicillins Anaphylaxis   Other reaction(s): Vomiting        Medication List     STOP taking these medications    zonisamide 100 MG capsule Commonly known as: ZONEGRAN       TAKE these medications    amphetamine-dextroamphetamine 20 MG tablet Commonly known as: ADDERALL Take 25 mg by mouth daily.   Adderall XR 25 MG 24 hr capsule Generic drug: amphetamine-dextroamphetamine Take 1 capsule by mouth every morning.   atorvastatin 10 MG tablet Commonly known as: LIPITOR Take 10 mg by mouth daily.   busPIRone 7.5 MG tablet Commonly known as: BUSPAR Take 7.5 mg by mouth 3  (three) times daily.   dicyclomine 20 MG tablet Commonly known as: BENTYL Take 1 tablet (20 mg total) by mouth 4 (four) times daily -  before meals and at bedtime for 5 days.   gabapentin 600 MG tablet Commonly known as: NEURONTIN Take 600 mg by mouth 3 (three) times daily.   Linzess 72 MCG capsule Generic drug: linaclotide Take 72 mcg by mouth daily.   lisinopril 5 MG tablet Commonly known as: ZESTRIL Take 5 mg by mouth every morning.   lithium 600 MG capsule Take 600 mg by mouth at bedtime.   propranolol 10 MG tablet Commonly known as: INDERAL Take 10 mg by mouth 2 (two) times daily.        Follow-up Information     Wyline Mood, MD. Schedule an appointment as soon as possible for a visit in 2 week(s).   Specialty: Gastroenterology Contact information: [address] [place] [address] [phone]                   If you experience worsening of your admission symptoms, develop shortness of breath, life threatening emergency, suicidal or homicidal thoughts you must seek medical attention immediately by calling 911 or calling your MD immediately  if symptoms less severe.   You must read complete instructions/literature along with all the possible adverse reactions/side effects for all the medicines you take and that have been prescribed to you. Take any new medicines after you have completely understood and accept all the possible adverse reactions/side effects.    Please note   You were cared for by a hospitalist during your hospital stay. If you have any questions about your discharge medications or the care you received while you were in the hospital after you are discharged, you can call the unit and asked to speak with the hospitalist on call if the hospitalist that took care  of you is not available. Once you are discharged, your primary care physician will handle any further medical issues. Please note that NO REFILLS for any discharge  medications will be authorized once you are discharged, as it is imperative that you return to your primary care physician (or establish a relationship with a primary care physician if you do not have one) for your aftercare needs so that they can reassess your need for medications and monitor your lab values.       Time coordinating discharge: Greater than 30 minutes  Signed:  Taesean Reth  [facility] 03/21/2021, 10:15 AM   Pager on [email or web address]. If 7PM-7AM, please contact night-coverage at [email or web address]

## 2021-03-21 NOTE — TOC Progression Note (Signed)
Transition of Care [facility]) - Progression Note    Patient Details  Name: Gina Price MRN: [id number] Date of Birth: 1990-10-15  Transition of [facility]) CM/SW [place], RN Phone Number: 03/21/2021, 10:48 AM  Clinical Narrative:   Met with the patient and provided Substance abuse resources, she stated that she goes to the Doctor at [facility] and can afford her medication, She has transportation with her boyfriend, no additional needs         Expected Discharge Plan and Services           Expected Discharge Date: 03/21/21                                     Social Determinants of Health (SDOH) Interventions    Readmission Risk Interventions No flowsheet data found.

## 2021-03-22 ENCOUNTER — Telehealth: Payer: Self-pay

## 2021-03-22 ENCOUNTER — Ambulatory Visit: Payer: Medicaid Other | Admitting: Gastroenterology

## 2021-03-22 DIAGNOSIS — K759 Inflammatory liver disease, unspecified: Secondary | ICD-10-CM

## 2021-03-22 DIAGNOSIS — K7010 Alcoholic hepatitis without ascites: Secondary | ICD-10-CM

## 2021-03-22 LAB — BLOOD CULTURE ID PANEL (REFLEXED) - BCID2

## 2021-03-22 LAB — CMV DNA, QUANTITATIVE, PCR
CMV DNA Quant: NEGATIVE IU/mL
Log10 CMV Qn DNA Pl: UNDETERMINED log10 IU/mL

## 2021-03-22 LAB — HEPATITIS B DNA, ULTRAQUANTITATIVE, PCR
HBV DNA SERPL PCR-ACNC: NOT DETECTED IU/mL
HBV DNA SERPL PCR-LOG IU: UNDETERMINED log10 IU/mL

## 2021-03-22 LAB — IGG 1, 2, 3, AND 4
IgG (Immunoglobin G), Serum: 971 mg/dL (ref 586–1602)
IgG, Subclass 1: 514 mg/dL (ref 248–810)
IgG, Subclass 2: 154 mg/dL (ref 130–555)
IgG, Subclass 3: 53 mg/dL (ref 15–102)
IgG, Subclass 4: 23 mg/dL (ref 2–96)

## 2021-03-22 LAB — ANTI-SMOOTH MUSCLE ANTIBODY, IGG: F-Actin IgG: 7 Units (ref 0–19)

## 2021-03-22 LAB — COPPER, SERUM: Copper: 42 ug/dL — ABNORMAL LOW (ref 80–158)

## 2021-03-22 LAB — ANA W/REFLEX: Anti Nuclear Antibody (ANA): NEGATIVE

## 2021-03-22 LAB — SOLUBLE LIVER AG (IGG AB): Anti-SLA, IgG: 1 units (ref 0.0–20.0)

## 2021-03-22 NOTE — Telephone Encounter (Signed)
Order the lab work and informed patient of this information

## 2021-03-22 NOTE — Telephone Encounter (Signed)
Called patient and reschedule patient appointment to 03/31/21 at 1:00pm through mychart. She states she is really worried about her blood work she states it looks like her body is not breaking down the [employer]. She states she still feels really bad and is scared about her liver

## 2021-03-22 NOTE — Progress Notes (Signed)
PHARMACY - PHYSICIAN COMMUNICATION CRITICAL VALUE ALERT - BLOOD CULTURE IDENTIFICATION (BCID)  Gina Price is an 31 y.o. female who presented to [facility] on 03/18/2021 with a chief complaint of  body aches and not feeling well.    Assessment:  Blood culture from 2/17 resulted today with GPR in 1 of 4 bottles.  BCID did not detect organism.  She was discharged 03/22/2021  Name of physician (or Provider) Contacted: Dr Myriam Forehand  Current antibiotics: NA  Changes to prescribed antibiotics recommended:  Await final identification - suspect contaminant  Results for orders placed or performed during the hospital encounter of 03/18/21  Blood Culture ID Panel (Reflexed) (Collected: 03/18/2021  3:49 PM)  Result Value Ref Range   Enterococcus faecalis NOT DETECTED NOT DETECTED   Enterococcus Faecium NOT DETECTED NOT DETECTED   Listeria monocytogenes NOT DETECTED NOT DETECTED   Staphylococcus species NOT DETECTED NOT DETECTED   Staphylococcus aureus (BCID) NOT DETECTED NOT DETECTED   Staphylococcus epidermidis NOT DETECTED NOT DETECTED   Staphylococcus lugdunensis NOT DETECTED NOT DETECTED   Streptococcus species NOT DETECTED NOT DETECTED   Streptococcus agalactiae NOT DETECTED NOT DETECTED   Streptococcus pneumoniae NOT DETECTED NOT DETECTED   Streptococcus pyogenes NOT DETECTED NOT DETECTED   A.calcoaceticus-baumannii NOT DETECTED NOT DETECTED   Bacteroides fragilis NOT DETECTED NOT DETECTED   Enterobacterales NOT DETECTED NOT DETECTED   Enterobacter cloacae complex NOT DETECTED NOT DETECTED   Escherichia coli NOT DETECTED NOT DETECTED   Klebsiella aerogenes NOT DETECTED NOT DETECTED   Klebsiella oxytoca NOT DETECTED NOT DETECTED   Klebsiella pneumoniae NOT DETECTED NOT DETECTED   Proteus species NOT DETECTED NOT DETECTED   Salmonella species NOT DETECTED NOT DETECTED   Serratia marcescens NOT DETECTED NOT DETECTED   Haemophilus influenzae NOT DETECTED NOT DETECTED   Neisseria meningitidis  NOT DETECTED NOT DETECTED   Pseudomonas aeruginosa NOT DETECTED NOT DETECTED   Stenotrophomonas maltophilia NOT DETECTED NOT DETECTED   Candida albicans NOT DETECTED NOT DETECTED   Candida auris NOT DETECTED NOT DETECTED   Candida glabrata NOT DETECTED NOT DETECTED   Candida krusei NOT DETECTED NOT DETECTED   Candida parapsilosis NOT DETECTED NOT DETECTED   Candida tropicalis NOT DETECTED NOT DETECTED   Cryptococcus neoformans/gattii NOT DETECTED NOT DETECTED    Juliette Alcide, PharmD, BCPS, BCIDP Work Cell: [phone] 03/22/2021 1:58 PM

## 2021-03-23 LAB — EPSTEIN BARR VRS(EBV DNA BY PCR): EBV DNA QN by PCR: NEGATIVE IU/mL

## 2021-03-23 LAB — CELIAC DISEASE PANEL
Endomysial Ab, IgA: NEGATIVE
Tissue Transglutaminase Ab, IgA: <2 U/mL (ref 0–3)

## 2021-03-23 LAB — CULTURE, BLOOD (ROUTINE X 2): Culture: NO GROWTH

## 2021-03-24 LAB — CULTURE, BLOOD (ROUTINE X 2)

## 2021-03-24 LAB — VARICELLA-ZOSTER BY PCR: Varicella-Zoster, PCR: NEGATIVE

## 2021-03-29 ENCOUNTER — Emergency Department
Admission: EM | Admit: 2021-03-29 | Discharge: 2021-03-29 | Disposition: A | Discharge status: Home / Self Care | Payer: Medicaid Other | Attending: Emergency Medicine | Admitting: Emergency Medicine

## 2021-03-29 ENCOUNTER — Encounter: Payer: Self-pay | Admitting: Emergency Medicine

## 2021-03-29 ENCOUNTER — Other Ambulatory Visit: Payer: Self-pay

## 2021-03-29 DIAGNOSIS — M79642 Pain in left hand: Secondary | ICD-10-CM | POA: Insufficient documentation

## 2021-03-29 DIAGNOSIS — M79605 Pain in left leg: Secondary | ICD-10-CM | POA: Diagnosis not present

## 2021-03-29 DIAGNOSIS — M79604 Pain in right leg: Secondary | ICD-10-CM | POA: Diagnosis not present

## 2021-03-29 DIAGNOSIS — M791 Myalgia, unspecified site: Secondary | ICD-10-CM

## 2021-03-29 DIAGNOSIS — M6281 Muscle weakness (generalized): Secondary | ICD-10-CM | POA: Diagnosis present

## 2021-03-29 DIAGNOSIS — M79641 Pain in right hand: Secondary | ICD-10-CM | POA: Diagnosis not present

## 2021-03-29 LAB — BASIC METABOLIC PANEL
Anion gap: 14 (ref 5–15)
BUN: 12 mg/dL (ref 6–20)
CO2: 24 mmol/L (ref 22–32)
Calcium: 9.5 mg/dL (ref 8.9–10.3)
Chloride: 102 mmol/L (ref 98–111)
Creatinine, Ser: 0.59 mg/dL (ref 0.44–1.00)
GFR, Estimated: >60 mL/min (ref >60)
Glucose, Bld: 88 mg/dL (ref 70–99)
Potassium: 4.1 mmol/L (ref 3.5–5.1)
Sodium: 140 mmol/L (ref 135–145)

## 2021-03-29 LAB — URINALYSIS, ROUTINE W REFLEX MICROSCOPIC
Bilirubin Urine: NEGATIVE
Glucose, UA: NEGATIVE mg/dL
Hgb urine dipstick: NEGATIVE
Ketones, ur: NEGATIVE mg/dL
Leukocytes,Ua: NEGATIVE
Nitrite: NEGATIVE
Protein, ur: NEGATIVE mg/dL
Specific Gravity, Urine: 1.004 — ABNORMAL LOW (ref 1.005–1.030)
pH: 6 (ref 5.0–8.0)

## 2021-03-29 LAB — CK: Total CK: 42 U/L (ref 38–234)

## 2021-03-29 LAB — HEPATIC FUNCTION PANEL
ALT: 53 U/L — ABNORMAL HIGH (ref 0–44)
AST: 72 U/L — ABNORMAL HIGH (ref 15–41)
Albumin: 3.8 g/dL (ref 3.5–5.0)
Alkaline Phosphatase: 135 U/L — ABNORMAL HIGH (ref 38–126)
Bilirubin, Direct: 0.3 mg/dL — ABNORMAL HIGH (ref 0.0–0.2)
Indirect Bilirubin: 0.5 mg/dL (ref 0.3–0.9)
Total Bilirubin: 0.8 mg/dL (ref 0.3–1.2)
Total Protein: 6.9 g/dL (ref 6.5–8.1)

## 2021-03-29 LAB — CBC
HCT: 38 % (ref 36.0–46.0)
Hemoglobin: 13 g/dL (ref 12.0–15.0)
MCH: 32.7 pg (ref 26.0–34.0)
MCHC: 34.2 g/dL (ref 30.0–36.0)
MCV: 95.5 fL (ref 80.0–100.0)
Platelets: 343 10*3/uL (ref 150–400)
RBC: 3.98 MIL/uL (ref 3.87–5.11)
RDW: 13.3 % (ref 11.5–15.5)
WBC: 6.5 10*3/uL (ref 4.0–10.5)
nRBC: 0 % (ref 0.0–0.2)

## 2021-03-29 LAB — POC URINE PREG, ED: Preg Test, Ur: NEGATIVE

## 2021-03-29 LAB — TROPONIN I (HIGH SENSITIVITY): Troponin I (High Sensitivity): 4 ng/L (ref <18)

## 2021-03-29 MED ORDER — SODIUM CHLORIDE 0.9 % IV BOLUS
1000.0000 mL | Freq: Once | INTRAVENOUS | Status: AC
  Administered 2021-03-29: 1000 mL via INTRAVENOUS

## 2021-03-29 MED ORDER — KETOROLAC TROMETHAMINE 30 MG/ML IJ SOLN
30.0000 mg | Freq: Once | INTRAMUSCULAR | Status: AC
  Administered 2021-03-29: 30 mg via INTRAVENOUS
  Filled 2021-03-29: qty 1

## 2021-03-29 MED ORDER — BACLOFEN 10 MG PO TABS: 10.0000 mg | ORAL_TABLET | Freq: Three times a day (TID) | ORAL | 0 refills | Status: AC

## 2021-03-29 MED ORDER — MELOXICAM 15 MG PO TABS: 15.0000 mg | ORAL_TABLET | Freq: Every day | ORAL | 0 refills | Status: DC

## 2021-03-29 NOTE — ED Provider Notes (Signed)
[facility] Provider Note    Event Date/Time   First MD Initiated Contact with Patient 03/29/21 2125     (approximate)   History   Weakness and Generalized Body Aches   HPI  Gina Price is a 31 y.o. female with history of alcoholic hepatitis, depression and anxiety along with a EtOH abuse presents emergency department complaining of generalized weakness and body aches since being released from [facility] on Saturday.  She was admitted here for alcoholic hepatitis.  She was given Ativan to prevent withdrawals.  States she did not get any medications when she was discharged.  States she did call up.  Asked for Ativan but did not get any.  States she is aching and feeling that her body is falling completely apart.  Her last drink was the day she came into the hospital where she was admitted for alcoholic hepatitis.  She denies any EtOH use since then.      Physical Exam   Triage Vital Signs: ED Triage Vitals  Enc Vitals Group     BP 03/29/21 1912 121/77     Pulse Rate 03/29/21 1912 (!) 111     Resp 03/29/21 1912 16     Temp 03/29/21 1912 98.7 F (37.1 C)     Temp Source 03/29/21 1912 Oral     SpO2 03/29/21 1912 100 %     Weight 03/29/21 1913 140 lb (63.5 kg)     Height 03/29/21 1913 5\' 1"  (1.549 m)     Head Circumference --      Peak Flow --      Pain Score 03/29/21 1913 8     Pain Loc --      Pain Edu? --      Excl. in GC? --     Most recent vital signs: Vitals:   03/29/21 1912 03/29/21 2215  BP: 121/77 117/84  Pulse: (!) 111 87  Resp: 16 16  Temp: 98.7 F (37.1 C)   SpO2: 100% 97%     General: Awake, no distress.   CV:  Good peripheral perfusion. regular rate and  rhythm Resp:  Normal effort. Lungs CTA Abd:  No distention.  Nontender Other:  Arms and legs are tender to palpation   ED Results / Procedures / Treatments   Labs (all labs ordered are listed, but only abnormal results are displayed) Labs Reviewed  URINALYSIS, ROUTINE W  REFLEX MICROSCOPIC - Abnormal; Notable for the following components:      Result Value   Color, Urine STRAW (*)    APPearance CLEAR (*)    Specific Gravity, Urine 1.004 (*)    All other components within normal limits  HEPATIC FUNCTION PANEL - Abnormal; Notable for the following components:   AST 72 (*)    ALT 53 (*)    Alkaline Phosphatase 135 (*)    Bilirubin, Direct 0.3 (*)    All other components within normal limits  BASIC METABOLIC PANEL  CBC  CK  POC URINE PREG, ED  TROPONIN I (HIGH SENSITIVITY)     EKG  EKG    RADIOLOGY     PROCEDURES:   Procedures   MEDICATIONS ORDERED IN ED: Medications  sodium chloride 0.9 % bolus 1,000 mL (1,000 mLs Intravenous New Bag/Given 03/29/21 2211)  ketorolac (TORADOL) 30 MG/ML injection 30 mg (30 mg Intravenous Given 03/29/21 2212)     IMPRESSION / MDM / ASSESSMENT AND PLAN / ED COURSE  I reviewed the triage vital signs  and the nursing notes.                              Differential diagnosis includes, but is not limited to, alcohol withdrawal, alcoholic hepatitis, rhabdomyolysis, muscle spasms, electrolyte imbalance  Labs at this time are reassuring, her CBC basic metabolic panel shows normal levels, no abnormality in her electrolytes.  Troponin is normal.  I did add a CK and hepatic panel.  Patient at this time will be given fluids along with an anti-inflammatory for pain.  Do not feel patient needs to be admitted, her labs are all normal and her hepatic panel has improved.  CK is normal, hepatic panel does show great improvement from when she was admitted.  They are still have small elevations which can be attributed to her recent alcoholic hepatitis.  She was instructed to avoid alcohol and Tylenol.  She given a prescription for muscle relaxer and meloxicam.  She is to drink plenty of fluids.  Follow-up with her regular doctor as needed.  Follow-up with [facility] to help her avoid alcohol.  Return emergency department worsening.   She is discharged stable condition.       FINAL CLINICAL IMPRESSION(S) / ED DIAGNOSES   Final diagnoses:  Muscle pain     Rx / DC Orders   ED Discharge Orders          Ordered    baclofen (LIORESAL) 10 MG tablet  3 times daily        03/29/21 2237    meloxicam (MOBIC) 15 MG tablet  Daily        03/29/21 2237             Note:  This document was prepared using Dragon voice recognition software and may include unintentional dictation errors.    Faythe Ghee, PA-C 03/29/21 2240    Shaune Pollack, MD 04/02/21 1107    Shaune Pollack, MD 04/02/21 1108

## 2021-03-29 NOTE — ED Triage Notes (Signed)
Pt to ED from home c/o generalized weakness and body aches since being released from [facility] inpatient Saturday.  States was admitted for "blood levels being all out of whack".  States the pain is all over, no where specific, denies abd pain or n/v/d.  States can't take the pain at this point.  Pt is A&Ox4, chest rise even and unlabored, skin WNL and in NAD at this time.

## 2021-03-29 NOTE — ED Notes (Signed)
Pt discharge information reviewed. Pt understands need for follow up care and when to return if symptoms worsen. All questions answered. Pt is alert and oriented with even and regular respirations. Pt is seen ambulating out of department with strong steady gait.   °

## 2021-03-29 NOTE — Discharge Instructions (Addendum)
Follow-up with your regular doctor as needed.  Follow-up with [facility] to help with alcohol.  Take the baclofen for muscle pain.  Meloxicam for pain.  Try to avoid Tylenol as your liver has mildly elevated liver enzymes.  Due to the alcoholic hepatitis she did not want to take Tylenol as it may damage her liver further. Return emergency department if you are worse

## 2021-03-31 ENCOUNTER — Telehealth (INDEPENDENT_AMBULATORY_CARE_PROVIDER_SITE_OTHER): Payer: Medicaid Other | Admitting: Gastroenterology

## 2021-03-31 DIAGNOSIS — F1021 Alcohol dependence, in remission: Secondary | ICD-10-CM | POA: Diagnosis not present

## 2021-03-31 DIAGNOSIS — Z8719 Personal history of other diseases of the digestive system: Secondary | ICD-10-CM | POA: Diagnosis not present

## 2021-03-31 DIAGNOSIS — R7989 Other specified abnormal findings of blood chemistry: Secondary | ICD-10-CM | POA: Diagnosis not present

## 2021-03-31 NOTE — Progress Notes (Signed)
Gina Price , MD [address]  [address]  [place], [place] [address]  Main: [phone]  Fax: [phone]   Primary Care Physician: System, Provider Not In  Virtual Visit via Video Note  I connected with patient on 03/31/21 at  1:00 PM EST by video and verified that I am speaking with the correct person using two identifiers.   I discussed the limitations, risks, security and privacy concerns of performing an evaluation and management service by video  and the availability of in person appointments. I also discussed with the patient that there may be a patient responsible charge related to this service. The patient expressed understanding and agreed to proceed.  Location of Patient: Home Location of Provider: Home Persons involved: Patient and provider only   History of Present Illness: Chief Complaint  Patient presents with   alcoholic hepatitis    HPI: Gina Price is a 31 y.o. female   Summary of history :  She is here today to see me for hospital follow-up.  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  Did not require steroids as she did not meet the criteria.  No evidence of portal vein thrombosis on liver Doppler.  03/19/2021 ultrasound of the liver Doppler showed no portal vein thrombosis.  Right upper quadrant ultrasound showed CBD 6 mm hepatic steatosis.  She underwent complete autoimmune and viral hepatitis screen showed only an elevated ferritin of 2900 with iron percentage saturation of 98% otherwise EBV CMV HSV VZV hepatitis B and C serology were negative.  HIV negative ceruloplasmin was low.  AST and ALT were greater than 300 with a total bilirubin of 2.1 and INR of 1.5  03/29/2021 LFTs have almost normalized significant improvement albumin 3.8.  Interval history    She is doing well since hospital discharge.  ER visit for muscle aches and pains lab work was reassuring  discharged home she is not drinking any alcohol at this point of time.     Current Outpatient Medications  Medication Sig Dispense Refill   ADDERALL XR 25 MG 24 hr capsule Take 1 capsule by mouth every morning.     amphetamine-dextroamphetamine (ADDERALL) 20 MG tablet Take 25 mg by mouth daily. (Patient not taking: Reported on 03/18/2021)     atorvastatin (LIPITOR) 10 MG tablet Take 10 mg by mouth daily.     baclofen (LIORESAL) 10 MG tablet Take 1 tablet (10 mg total) by mouth 3 (three) times daily for 7 days. 21 tablet 0   busPIRone (BUSPAR) 7.5 MG tablet Take 7.5 mg by mouth 3 (three) times daily. (Patient not taking: Reported on 03/18/2021)     dicyclomine (BENTYL) 20 MG tablet Take 1 tablet (20 mg total) by mouth 4 (four) times daily -  before meals and at bedtime for 5 days. 20 tablet 0   gabapentin (NEURONTIN) 600 MG tablet Take 600 mg by mouth 3 (three) times daily.     LINZESS 72 MCG capsule Take 72 mcg by mouth daily.     lisinopril (ZESTRIL) 5 MG tablet Take 5 mg by mouth every morning.     lithium 600 MG capsule Take 600 mg by mouth at bedtime.     meloxicam (MOBIC) 15 MG tablet Take 1 tablet (15 mg total) by mouth daily. 15 tablet 0   propranolol (INDERAL) 10 MG tablet Take 10 mg by mouth 2 (two) times daily.  No current facility-administered medications for this visit.    Allergies as of 03/31/2021 - Review Complete 03/29/2021  Allergen Reaction Noted   Albuterol Shortness Of Breath and Anaphylaxis 04/14/2020   Penicillins Anaphylaxis 08/26/2015    Review of Systems:    All systems reviewed and negative except where noted in HPI.  General Appearance:    Alert, cooperative, no distress, appears stated age  Head:    Normocephalic, without obvious abnormality, atraumatic  Eyes:    PERRL, conjunctiva/corneas clear,  Ears:    Grossly normal hearing    Neurologic:  Grossly normal    Observations/Objective:  Labs: CMP     Component Value Date/Time   NA 140  03/29/2021 1919   K 4.1 03/29/2021 1919   CL 102 03/29/2021 1919   CO2 24 03/29/2021 1919   GLUCOSE 88 03/29/2021 1919   BUN 12 03/29/2021 1919   CREATININE 0.59 03/29/2021 1919   CALCIUM 9.5 03/29/2021 1919   PROT 6.9 03/29/2021 1919   ALBUMIN 3.8 03/29/2021 1919   AST 72 (H) 03/29/2021 1919   ALT 53 (H) 03/29/2021 1919   ALKPHOS 135 (H) 03/29/2021 1919   BILITOT 0.8 03/29/2021 1919   GFRNONAA >60 03/29/2021 1919   GFRAA >60 05/01/2019 1619   Lab Results  Component Value Date   WBC 6.5 03/29/2021   HGB 13.0 03/29/2021   HCT 38.0 03/29/2021   MCV 95.5 03/29/2021   PLT 343 03/29/2021    Imaging Studies: NM Hepatobiliary Liver Func  Result Date: 03/20/2021 CLINICAL DATA:  31 year old female with abdominal pain and elevated bilirubin. EXAM: NUCLEAR MEDICINE HEPATOBILIARY IMAGING TECHNIQUE: Sequential images of the abdomen were obtained out to 60 minutes following intravenous administration of radiopharmaceutical. RADIOPHARMACEUTICALS:  7.8 mCi Tc-98m  Choletec IV COMPARISON:  Recent ultrasounds FINDINGS: Prompt uptake and biliary excretion of activity by the liver is seen. Gallbladder activity is visualized, consistent with patency of cystic duct. Biliary activity passes into small bowel, consistent with patent common bile duct. IMPRESSION: Unremarkable exam. No evidence of cystic duct obstruction/acute cholecystitis. Electronically Signed   By: Harmon Pier M.D.   On: 03/20/2021 16:23   US LIVER DOPPLER  Result Date: 03/20/2021 CLINICAL DATA:  31 year old female with liver failure. EXAM: DUPLEX ULTRASOUND OF LIVER TECHNIQUE: Color and duplex Doppler ultrasound was performed to evaluate the hepatic in-flow and out-flow vessels. COMPARISON:  03/18/2021 FINDINGS: Liver: Increased echogenicity in the liver with poor visualization of the internal architecture. Findings are suggestive for steatosis. No intrahepatic biliary dilatation. Normal hepatic contour without nodularity. No focal liver  lesion. Main Portal Vein size: 0.8 cm Portal Vein Velocities Main Prox:  30 cm/sec Main Mid: 34 cm/sec Main Dist:  30 cm/sec Right: 22 cm/sec Left: 19 cm/sec Hepatic Vein Velocities Right:  31 cm/sec Middle:  30 cm/sec Left:  24 cm/sec IVC: Present and patent with normal respiratory phasicity. Hepatic Artery Velocity:  135 cm/sec Splenic Vein Velocity:  11 cm/sec Spleen: 9.3 cm x 10.3 cm x 3.5 cm with a total volume of 168 cm^3 (411 cm^3 is upper limit normal) Portal Vein Occlusion/Thrombus: No Splenic Vein Occlusion/Thrombus: No Ascites: None Varices: None Normal hepatopetal flow in the portal veins. Normal hepatofugal flow in the hepatic veins. IMPRESSION: 1. Portal venous system is patent with normal direction of flow. 2. Increased echogenicity in the hepatic parenchyma. Findings are suggestive for steatosis. Electronically Signed   By: Richarda Overlie M.D.   On: 03/20/2021 09:27   US ABDOMEN LIMITED RUQ (LIVER/GB)  Result Date: 03/18/2021  CLINICAL DATA:  Abdominal pain EXAM: ULTRASOUND ABDOMEN LIMITED RIGHT UPPER QUADRANT COMPARISON:  Right upper quadrant ultrasound 02/20/2021 FINDINGS: Gallbladder: Distended, similar in appearance to prior exam. No intraluminal gallstones or polyps. Upper normal gallbladder wall thickness of 3-4 mm. Trace pericholecystic fluid. No sonographic Murphy sign noted by sonographer. Common bile duct: Diameter: 6 mm.  No visualized choledocholithiasis. Liver: Diffusely increased parenchymal echogenicity. There is focal fatty sparing adjacent to the gallbladder fossa. No focal hepatic lesion. No intrahepatic biliary ductal dilatation. No capsular nodularity. Portal vein is patent on color Doppler imaging with normal direction of blood flow towards the liver. Other: None. IMPRESSION: 1. Gallbladder distension, similar to prior exam. There is mild gallbladder wall thickening and trace pericholecystic fluid. No gallstones. This may represent acalculous cholecystitis in the appropriate  clinical setting. 2. Upper normal common bile duct at 6 mm. 3. Increased hepatic parenchymal echogenicity, typically seen with steatosis. Electronically Signed   By: Narda Rutherford M.D.   On: 03/18/2021 17:10    Assessment and Plan:   Gina Price is a 31 y.o. y/o female here to follow-up after hospitalization with acute liver failure secondary to possible acute alcoholic hepatitis.  Liver work-up was negative except for a low ceruloplasmin level and a high ferritin, iron percentage saturation level. That could be due to acute hepatitis and inflammation giving a falsely elevated value.  Plan 1.  Continue alcohol cessation 2.  Repeat iron studies, HFE gene mutation, ceruloplasmin levels since were abnormal previously ,INR 3.  Good nutrition 4.  Check hepatitis A total antibody and hepatitis B surface antibody and if negative will require vaccination 5.  Repeat LFTs in 4 weeks  Follow up in 8 weeks     I discussed the assessment and treatment plan with the patient. The patient was provided an opportunity to ask questions and all were answered. The patient agreed with the plan and demonstrated an understanding of the instructions.   The patient was advised to call back or seek an in-person evaluation if the symptoms worsen or if the condition fails to improve as anticipated.  I provided 15 minutes of face-to-face time during this encounter.  Dr Gina Mood MD,MRCP [facility]) Gastroenterology/Hepatology Pager: [phone]   Speech recognition software was used to dictate this note.

## 2021-04-11 ENCOUNTER — Other Ambulatory Visit: Payer: Self-pay

## 2021-04-11 ENCOUNTER — Emergency Department
Admission: EM | Admit: 2021-04-11 | Discharge: 2021-04-11 | Disposition: A | Discharge status: Home / Self Care | Payer: Medicaid Other | Attending: Student in an Organized Health Care Education/Training Program | Admitting: Student in an Organized Health Care Education/Training Program

## 2021-04-11 ENCOUNTER — Encounter: Payer: Self-pay | Admitting: Emergency Medicine

## 2021-04-11 DIAGNOSIS — M791 Myalgia, unspecified site: Secondary | ICD-10-CM | POA: Diagnosis not present

## 2021-04-11 DIAGNOSIS — R300 Dysuria: Secondary | ICD-10-CM | POA: Diagnosis not present

## 2021-04-11 DIAGNOSIS — R42 Dizziness and giddiness: Secondary | ICD-10-CM

## 2021-04-11 DIAGNOSIS — R5383 Other fatigue: Secondary | ICD-10-CM | POA: Diagnosis not present

## 2021-04-11 DIAGNOSIS — R1084 Generalized abdominal pain: Secondary | ICD-10-CM | POA: Diagnosis not present

## 2021-04-11 DIAGNOSIS — L299 Pruritus, unspecified: Secondary | ICD-10-CM

## 2021-04-11 LAB — PREGNANCY, URINE: Preg Test, Ur: NEGATIVE

## 2021-04-11 LAB — CBC
HCT: 36.7 % (ref 36.0–46.0)
Hemoglobin: 12.1 g/dL (ref 12.0–15.0)
MCH: 32 pg (ref 26.0–34.0)
MCHC: 33 g/dL (ref 30.0–36.0)
MCV: 97.1 fL (ref 80.0–100.0)
Platelets: 308 10*3/uL (ref 150–400)
RBC: 3.78 MIL/uL — ABNORMAL LOW (ref 3.87–5.11)
RDW: 14 % (ref 11.5–15.5)
WBC: 9.1 10*3/uL (ref 4.0–10.5)
nRBC: 0 % (ref 0.0–0.2)

## 2021-04-11 LAB — URINALYSIS, ROUTINE W REFLEX MICROSCOPIC
Bilirubin Urine: NEGATIVE
Glucose, UA: NEGATIVE mg/dL
Hgb urine dipstick: NEGATIVE
Ketones, ur: NEGATIVE mg/dL
Leukocytes,Ua: NEGATIVE
Nitrite: NEGATIVE
Protein, ur: NEGATIVE mg/dL
Specific Gravity, Urine: 1.018 (ref 1.005–1.030)
pH: 6 (ref 5.0–8.0)

## 2021-04-11 LAB — COMPREHENSIVE METABOLIC PANEL
ALT: 19 U/L (ref 0–44)
AST: 29 U/L (ref 15–41)
Albumin: 4.3 g/dL (ref 3.5–5.0)
Alkaline Phosphatase: 69 U/L (ref 38–126)
Anion gap: 11 (ref 5–15)
BUN: 13 mg/dL (ref 6–20)
CO2: 28 mmol/L (ref 22–32)
Calcium: 9.4 mg/dL (ref 8.9–10.3)
Chloride: 94 mmol/L — ABNORMAL LOW (ref 98–111)
Creatinine, Ser: 0.7 mg/dL (ref 0.44–1.00)
GFR, Estimated: >60 mL/min (ref >60)
Glucose, Bld: 96 mg/dL (ref 70–99)
Potassium: 3.3 mmol/L — ABNORMAL LOW (ref 3.5–5.1)
Sodium: 133 mmol/L — ABNORMAL LOW (ref 135–145)
Total Bilirubin: 1 mg/dL (ref 0.3–1.2)
Total Protein: 7.2 g/dL (ref 6.5–8.1)

## 2021-04-11 LAB — WET PREP, GENITAL
Clue Cells Wet Prep HPF POC: NONE SEEN
Sperm: NONE SEEN
Trich, Wet Prep: NONE SEEN
WBC, Wet Prep HPF POC: <10 (ref <10)
Yeast Wet Prep HPF POC: NONE SEEN

## 2021-04-11 LAB — CHLAMYDIA/NGC RT PCR (ARMC ONLY)
Chlamydia Tr: NOT DETECTED
N gonorrhoeae: NOT DETECTED

## 2021-04-11 LAB — PROTIME-INR
INR: 1.1 (ref 0.8–1.2)
Prothrombin Time: 14 seconds (ref 11.4–15.2)

## 2021-04-11 LAB — LIPASE, BLOOD: Lipase: 44 U/L (ref 11–51)

## 2021-04-11 LAB — AMMONIA: Ammonia: 30 umol/L (ref 9–35)

## 2021-04-11 LAB — POC URINE PREG, ED: Preg Test, Ur: NEGATIVE

## 2021-04-11 MED ORDER — SODIUM CHLORIDE 0.9 % IV BOLUS
1000.0000 mL | Freq: Once | INTRAVENOUS | Status: AC
  Administered 2021-04-11: 1000 mL via INTRAVENOUS

## 2021-04-11 MED ORDER — PROMETHAZINE HCL 12.5 MG PO TABS: 12.5000 mg | ORAL_TABLET | Freq: Four times a day (QID) | ORAL | 0 refills | Status: AC | PRN

## 2021-04-11 NOTE — ED Notes (Signed)
Called lab, they do not have bloodwork for pt. Will draw again with IV start. Pt was stuck in triage. ?

## 2021-04-11 NOTE — ED Notes (Signed)
Pt to ED for abdominal pain, states pain is severe, states "I keep getting super red, rashy and itchy and lightheaded'. Was recently seen here for abdominal pain, pt has cirrhosis and was transferred to other hospital last time. ?

## 2021-04-11 NOTE — ED Provider Notes (Signed)
? ?[facility] ?Provider Note ? ? ? Event Date/Time  ? First MD Initiated Contact with Patient 04/11/21 1121   ?  (approximate) ? ? ?History  ? ?Abdominal Pain ? ? ?HPI ? ?Gina Price is a 31 y.o. female complex past recent medical history with recent admission to the hospital in February for alcoholic hepatitis and liver failure presenting to the ER for persistent episodes of intermittent swelling itchiness fatigue myalgias.  States she is having not been drinking is not taking Tylenol.  She feels bloated.  Has had some dysuria and urine with clumps and.  Denies any vaginal discharge or bleeding.  No fevers or chills. ?  ? ? ?Physical Exam  ? ?Triage Vital Signs: ?ED Triage Vitals  ?Enc Vitals Group  ?   BP 04/11/21 1053 123/86  ?   Pulse Rate 04/11/21 1053 (!) 112  ?   Resp 04/11/21 1053 17  ?   Temp --   ?   Temp src --   ?   SpO2 04/11/21 1053 99 %  ?   Weight 04/11/21 1055 135 lb (61.2 kg)  ?   Height 04/11/21 1055 5\' 1"  (1.549 m)  ?   Head Circumference --   ?   Peak Flow --   ?   Pain Score 04/11/21 1054 5  ?   Pain Loc --   ?   Pain Edu? --   ?   Excl. in GC? --   ? ? ?Most recent vital signs: ?Vitals:  ? 04/11/21 1156 04/11/21 1410  ?BP:  120/80  ?Pulse:  99  ?Resp:  16  ?Temp: 98.2 ?F (36.8 ?C)   ?SpO2:  99%  ? ? ? ?Constitutional: Alert  ?Eyes: Conjunctivae are normal.  ?Head: Atraumatic. ?Nose: No congestion/rhinnorhea. ?Mouth/Throat: Mucous membranes are moist.   ?Neck: Painless ROM.  ?Cardiovascular:   Good peripheral circulation. ?Respiratory: Normal respiratory effort.  No retractions.  ?Gastrointestinal: Soft and nontender in all four quadrants ?Musculoskeletal:  no deformity ?Neurologic:  MAE spontaneously. No gross focal neurologic deficits are appreciated.  ?Skin:  Skin is warm, dry and intact. No rash noted. ?Psychiatric: Mood and affect are normal. Speech and behavior are normal. ? ? ? ?ED Results / Procedures / Treatments  ? ?Labs ?(all labs ordered are listed, but only  abnormal results are displayed) ?Labs Reviewed  ?CBC - Abnormal; Notable for the following components:  ?    Result Value  ? RBC 3.78 (*)   ? All other components within normal limits  ?COMPREHENSIVE METABOLIC PANEL - Abnormal; Notable for the following components:  ? Sodium 133 (*)   ? Potassium 3.3 (*)   ? Chloride 94 (*)   ? All other components within normal limits  ?URINALYSIS, ROUTINE W REFLEX MICROSCOPIC - Abnormal; Notable for the following components:  ? Color, Urine YELLOW (*)   ? APPearance HAZY (*)   ? All other components within normal limits  ?WET PREP, GENITAL  ?CHLAMYDIA/NGC RT PCR ([facility] ONLY)            ?AMMONIA  ?PROTIME-INR  ?LIPASE, BLOOD  ?PREGNANCY, URINE  ?POC URINE PREG, ED  ? ? ? ?EKG ? ?ED ECG REPORT ?I, 04/13/21, the attending physician, personally viewed and interpreted this ECG. ? ? Date: 04/11/2021 ? EKG Time: 10:58 ? Rate: 105 ? Rhythm: sinus ? Axis: normal ? Intervals:normal intervals,  ? ST&T Change: no stemi ? ? ? ?RADIOLOGY ? ? ? ?PROCEDURES: ? ?Critical Care  performed:  ? ?Procedures ? ? ?MEDICATIONS ORDERED IN ED: ?Medications  ?sodium chloride 0.9 % bolus 1,000 mL (0 mLs Intravenous Stopped 04/11/21 1411)  ? ? ? ?IMPRESSION / MDM / ASSESSMENT AND PLAN / ED COURSE  ?I reviewed the triage vital signs and the nursing notes. ?             ?               ? ?Differential diagnosis includes, but is not limited to, hydration, electrolyte abnormality, hyperbilirubinemia, liver failure, UTI hyperammonemia ? ?Patient presenting with symptoms as described above.  She is clinically well-appearing afebrile hemodynamically stable with benign abdominal exam reassuring physical exam.  Patient here with several complaints seems that she is primarily concerned about some dysuria as well as some itching though she does not have any findings to suggest allergic reaction and pruritus or urticaria. ? ?Clinical Course as of 04/11/21 1627  ?Mon Apr 11, 2021  ?1348 Patient is been up ambulating  without any discomfort several times.  Her work-up is reassuring no leukocytosis electrolytes normal no sign of UTI.  She states that she is having some irregular discharge.  Discussed option for pelvic exam and send off test for STI.  Patient declining pelvic exam but agreeable to self swab.  Repeat abdominal exam is soft and benign.  Does not seem consistent with PID TOA or appendicitis.  Patient for complaining of multiple complaints including itchiness as well as lightheadedness she received IV fluids her neuro exam is reassuring.  No signs of allergic reaction at this time.  This point I do believe she stable and appropriate for outpatient follow-up do not feel that emergent imaging clinically indicated at this time with these complaints.  We discussed strict return precautions.  Patient agreeable to plan. [PR]  ?  ?Clinical Course User Index ?[PR] Willy Eddy, MD  ? ? ? ?FINAL CLINICAL IMPRESSION(S) / ED DIAGNOSES  ? ?Final diagnoses:  ?Itching  ?Generalized abdominal pain  ?Dizziness  ? ? ? ?Rx / DC Orders  ? ?ED Discharge Orders   ? ?      Ordered  ?  promethazine (PHENERGAN) 12.5 MG tablet  Every 6 hours PRN       ? 04/11/21 1347  ? ?  ?  ? ?  ? ? ? ?Note:  This document was prepared using Dragon voice recognition software and may include unintentional dictation errors. ? ?  ?Willy Eddy, MD ?04/11/21 1627 ? ?

## 2021-04-11 NOTE — Discharge Instructions (Signed)

## 2021-04-11 NOTE — ED Triage Notes (Addendum)
Pt to ED via POV with suitcase stating that her abd is hurting. She reports that all her muscles hurt, weakness, and seeing stars. She reports that her mother thinks she has lupus and that she has been here several times and the last few months and she still has the bruises from the sticks. Denies having any N/V but she feels that she may have diarrhea soon but has not had any. Pt just keeps adding more and more complaints. There is not much on her body that isnt hurting or have a problem with according to pt. She is able to speak in complete clear sentences. NAD. VS WNL ?

## 2021-04-25 ENCOUNTER — Other Ambulatory Visit: Payer: Self-pay | Admitting: Gastroenterology

## 2021-04-25 ENCOUNTER — Other Ambulatory Visit: Payer: Self-pay

## 2021-04-25 ENCOUNTER — Encounter: Payer: Self-pay | Admitting: Gastroenterology

## 2021-04-25 ENCOUNTER — Ambulatory Visit (INDEPENDENT_AMBULATORY_CARE_PROVIDER_SITE_OTHER): Payer: Medicaid Other | Admitting: Gastroenterology

## 2021-04-25 VITALS — BP 120/67 | HR 92 | Temp 99.5°F | Ht 61.0 in | Wt 139.0 lb

## 2021-04-25 DIAGNOSIS — R109 Unspecified abdominal pain: Secondary | ICD-10-CM | POA: Diagnosis not present

## 2021-04-25 DIAGNOSIS — K5900 Constipation, unspecified: Secondary | ICD-10-CM

## 2021-04-25 DIAGNOSIS — R7989 Other specified abnormal findings of blood chemistry: Secondary | ICD-10-CM | POA: Diagnosis not present

## 2021-04-25 DIAGNOSIS — Z8719 Personal history of other diseases of the digestive system: Secondary | ICD-10-CM

## 2021-04-25 DIAGNOSIS — K2960 Other gastritis without bleeding: Secondary | ICD-10-CM

## 2021-04-25 MED ORDER — OMEPRAZOLE 40 MG PO CPDR: 40.0000 mg | DELAYED_RELEASE_CAPSULE | Freq: Every day | ORAL | 3 refills | Status: AC

## 2021-04-25 NOTE — Progress Notes (Signed)
?  ?Wyline Mood MD, MRCP(U.K) ?[address]  ?[address]  ?[place], [place] [address]  ?Main: [phone]  ?Fax: [phone] ? ? ?Primary Care Physician: Pcp, No ? ?Primary Gastroenterologist:  Dr. Wyline Mood  ? ?Chief Complaint  ?Patient presents with  ? History of alcoholic hepatitis  ? ? ?HPI: Gina Price is a 31 y.o. female ? ? ?Summary of history : ?  ?She is here today to see me for hospital follow-up.  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  Did not require steroids as she did not meet the criteria.  No evidence of portal vein thrombosis on liver Doppler. ?  ?03/19/2021 ultrasound of the liver Doppler showed no portal vein thrombosis.  Right upper quadrant ultrasound showed CBD 6 mm hepatic steatosis. ?  ?She underwent complete autoimmune and viral hepatitis screen showed only an elevated ferritin of 2900 with iron percentage saturation of 98% otherwise EBV CMV HSV VZV hepatitis B and C serology were negative.  HIV negative ceruloplasmin was low.  AST and ALT were greater than 300 with a total bilirubin of 2.1 and INR of 1.5 ? ?03/29/2021 LFTs have almost normalized significant improvement albumin 3.8. ?  ?Interval history   03/31/2021-04/25/2021 ? ?Did not obtain any of the labs reordered the last visit.  Subsequently had labs on 04/11/2021 with the INR was normalized at 1.1 CMP showed normal transaminases.  And hemoglobin was 12.1 g ? ?She came into my office and inform me that she been having generalized abdominal discomfort for over a week.  Not having very good bowel movements.  Was prescribed Linzess but has not been taking it.  Denies any alcohol use.  She has been taking ibuprofen on a daily basis for at least a week if not longer.  Not on any PPI.  Denies any hematemesis denies any melena. ? ? ? ?Current Outpatient Medications  ?Medication Sig Dispense Refill  ? ADDERALL XR 25 MG 24 hr capsule Take 1 capsule by mouth  every morning.    ? amphetamine-dextroamphetamine (ADDERALL) 20 MG tablet Take 25 mg by mouth daily.    ? atorvastatin (LIPITOR) 10 MG tablet Take 10 mg by mouth daily.    ? busPIRone (BUSPAR) 7.5 MG tablet Take 7.5 mg by mouth 3 (three) times daily.    ? dicyclomine (BENTYL) 20 MG tablet Take 1 tablet (20 mg total) by mouth 4 (four) times daily -  before meals and at bedtime for 5 days. 20 tablet 0  ? gabapentin (NEURONTIN) 600 MG tablet Take 600 mg by mouth 3 (three) times daily.    ? lisinopril (ZESTRIL) 5 MG tablet Take 5 mg by mouth every morning.    ? lithium 600 MG capsule Take 600 mg by mouth at bedtime.    ? meloxicam (MOBIC) 15 MG tablet Take 1 tablet (15 mg total) by mouth daily. 15 tablet 0  ? promethazine (PHENERGAN) 12.5 MG tablet Take 1 tablet (12.5 mg total) by mouth every 6 (six) hours as needed for nausea or vomiting. 16 tablet 0  ? propranolol (INDERAL) 10 MG tablet Take 10 mg by mouth 2 (two) times daily.    ? LINZESS 72 MCG capsule Take 72 mcg by mouth daily. (Patient not taking: Reported on 04/25/2021)    ? ?No current facility-administered medications for this visit.  ? ? ?Allergies as of 04/25/2021 - Review Complete 04/25/2021  ?Allergen Reaction Noted  ?  Albuterol Shortness Of Breath and Anaphylaxis 04/14/2020  ? Penicillins Anaphylaxis 08/26/2015  ? ? ?ROS: ? ?General: Negative for anorexia, weight loss, fever, chills, fatigue, weakness. ?ENT: Negative for hoarseness, difficulty swallowing , nasal congestion. ?CV: Negative for chest pain, angina, palpitations, dyspnea on exertion, peripheral edema.  ?Respiratory: Negative for dyspnea at rest, dyspnea on exertion, cough, sputum, wheezing.  ?GI: See history of present illness. ?GU:  Negative for dysuria, hematuria, urinary incontinence, urinary frequency, nocturnal urination.  ?Endo: Negative for unusual weight change.  ?  ?Physical Examination: ? ? BP 120/67   Pulse 92   Temp 99.5 ?F (37.5 ?C) (Oral)   Ht 5\' 1"  (1.549 m)   Wt 139 lb (63  kg)   BMI 26.26 kg/m?  ? ?General: Well-nourished, well-developed in no acute distress.  ?Eyes: No icterus. Conjunctivae pink. ?Mouth: Oropharyngeal mucosa moist and pink , no lesions erythema or exudate. ?Lungs: Clear to auscultation bilaterally. Non-labored. ?Heart: Regular rate and rhythm, no murmurs rubs or gallops.  ?Abdomen: Bowel sounds are normal, nontender, nondistended, no hepatosplenomegaly or masses, no abdominal bruits or hernia , no rebound or guarding.   ?Extremities: No lower extremity edema. No clubbing or deformities. ?Neuro: Alert and oriented x 3.  Grossly intact. ?Skin: Warm and dry, no jaundice.   ?Psych: Alert and cooperative, normal mood and affect. ? ? ?Imaging Studies: ?No results found. ? ?Assessment and Plan:  ? ?Gina Price is a 31 y.o. 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  Presently liver tests have completely normalized with INR of 1.1. ?Liver work-up was negative except for a low ceruloplasmin level and a high ferritin, iron percentage saturation level. That could be due to acute hepatitis and inflammation giving a falsely elevated value.  At this point of time she has some abdominal discomfort likely secondary to constipation and could also be related to mucosal injury from chronic NSAID use. ?  ?Plan ?1.  Continue alcohol cessation ?2.  Repeat iron studies, HFE gene mutation, ceruloplasmin levels since were abnormal previously-Labs were previously ordered but she did not obtain them. ?3.  Good nutrition ?4.  Check hepatitis A total antibody and hepatitis B surface antibody and if negative will require vaccination ?5.  Check H. pylori breath test, avoid all NSAIDs advised today commence on Prilosec 40 mg a day.  Advised to start Linzess and take it on a daily basis and let me know in a week if not feeling any better. ? ? ?Dr 34  MD,MRCP [facility]) ?Follow up in 2 to 3 months.   ?

## 2021-04-25 NOTE — Patient Instructions (Signed)
Please take Miralax 17 grams daily. ? ?Polyethylene Glycol Powder ?What is this medication? ?POLYETHYLENE GLYCOL (pol ee ETH i leen; GLYE col) prevents and treats occasional constipation. It works by softening the stool, making it easier to have a bowel movement. It belongs to a group of medications called laxatives. ?This medicine may be used for other purposes; ask your health care provider or pharmacist if you have questions. ?COMMON BRAND NAME(S): GaviLax, GIALAX, GlycoLax, Healthylax, MiraLax, Smooth LAX, Vita Health ?What should I tell my care team before I take this medication? ?They need to know if you have any of these conditions: ?History of blockage in your bowels ?Nausea ?Phenylketonuria ?Stomach or intestine problem ?Stomach pain ?Sudden change in bowel habit lasting more than 2 weeks ?Vomiting ?An unusual or allergic reaction to polyethylene glycol (PEG), other medications, foods, dyes, or preservatives ?Pregnant or trying to get pregnant ?Breast-feeding ?How should I use this medication? ?Take this medication by mouth. Take it as directed on the label. Add the right dose to 4 to 8 ounces or 120 to 240 mL of water, juice, soda, coffee or tea. Do not mix this medication with foods or other liquids. Do not combine with starch-based thickeners (e.g., flour, cornstarch, arrowroot, tapioca, xanthan gum). Mix well. Drink the solution. Do not use it more often than directed. ?Talk to your care team about the use of this medication in children. While it may be given to children as young as 16 years for selected conditions, precautions do apply. ?Overdosage: If you think you have taken too much of this medicine contact a poison control center or emergency room at once. ?NOTE: This medicine is only for you. Do not share this medicine with others. ?What if I miss a dose? ?If you miss a dose, take it as soon as you can. If it is almost time for your next dose, take only that dose. Do not take double or extra  doses. ?What may interact with this medication? ?Interactions are not expected. ?This list may not describe all possible interactions. Give your health care provider a list of all the medicines, herbs, non-prescription drugs, or dietary supplements you use. Also tell them if you smoke, drink alcohol, or use illegal drugs. Some items may interact with your medicine. ?What should I watch for while using this medication? ?Do not use for more than one week without advice from your care team. If your constipation returns, check with your care team. ?Drink plenty of water while taking this medication. Drinking water helps decrease constipation. ?Stop using this medication and contact your care team if you experience any rectal bleeding or do not have a bowel movement after use. These could be signs of a more serious condition. ?What side effects may I notice from receiving this medication? ?Side effects that you should report to your care team as soon as possible: ?Allergic reactions--skin rash, itching, hives, swelling of the face, lips, tongue, or throat ?Side effects that usually do not require medical attention (report to your care team if they continue or are bothersome): ?Bloating ?Gas ?Nausea ?Stomach cramping ?This list may not describe all possible side effects. Call your doctor for medical advice about side effects. You may report side effects to FDA at 1-800-FDA-1088. ?Where should I keep my medication? ?Keep out of the reach of children and pets. ?Store at room temperature between 20 and 25 degrees C (68 and 77 degrees F). Get rid of any unused medication after the expiration date. ?To get rid of  medications that are no longer needed or have expired: ?Take the medication to a medication take-back program. Check with your pharmacy or law enforcement to find a location. ?If you cannot return the medication, check the label or package insert to see if the medication should be thrown out in the garbage or flushed  down the toilet. If you are not sure, ask your care team. If it is safe to put it in the trash, pour the medication out of the container. Mix the medication with cat litter, dirt, coffee grounds, or other unwanted substance. Seal the mixture in a bag or container. Put it in the trash. ?NOTE: This sheet is a summary. It may not cover all possible information. If you have questions about this medicine, talk to your doctor, pharmacist, or health care provider. ?? 2022 Elsevier/Gold Standard (2020-05-22 00:00:00) ? ?

## 2021-04-26 ENCOUNTER — Telehealth: Payer: Medicaid Other | Admitting: Gastroenterology

## 2021-04-26 LAB — HEPATIC FUNCTION PANEL
ALT: 26 IU/L (ref 0–32)
AST: 36 IU/L (ref 0–40)
Albumin: 4.6 g/dL (ref 3.8–4.8)
Alkaline Phosphatase: 75 IU/L (ref 44–121)
Bilirubin Total: 0.4 mg/dL (ref 0.0–1.2)
Bilirubin, Direct: 0.16 mg/dL (ref 0.00–0.40)
Total Protein: 6.9 g/dL (ref 6.0–8.5)

## 2021-04-26 LAB — HEPATITIS A ANTIBODY, TOTAL: hep A Total Ab: NEGATIVE

## 2021-04-26 LAB — IRON,TIBC AND FERRITIN PANEL
Ferritin: 756 ng/mL — ABNORMAL HIGH (ref 15–150)
Iron Saturation: 21 % (ref 15–55)
Iron: 64 ug/dL (ref 27–159)
Total Iron Binding Capacity: 310 ug/dL (ref 250–450)
UIBC: 246 ug/dL (ref 131–425)

## 2021-04-26 LAB — CERULOPLASMIN: Ceruloplasmin: 22.8 mg/dL (ref 19.0–39.0)

## 2021-04-26 LAB — HEPATITIS B SURFACE ANTIBODY,QUALITATIVE: Hep B Surface Ab, Qual: REACTIVE

## 2021-04-26 LAB — PROTIME-INR
INR: 1.2 (ref 0.9–1.2)
Prothrombin Time: 12.3 s — ABNORMAL HIGH (ref 9.1–12.0)

## 2021-04-26 NOTE — Progress Notes (Signed)
Needs hep a vaccine

## 2021-04-27 LAB — H. PYLORI BREATH TEST: H pylori Breath Test: NEGATIVE

## 2021-04-28 ENCOUNTER — Telehealth: Payer: Self-pay

## 2021-04-28 NOTE — Telephone Encounter (Signed)
Called patient but couldn't leave her a voicemail. Therefore, I will send her a MyChart message with the below information. ?

## 2021-04-28 NOTE — Telephone Encounter (Signed)
-----   Message from Jonathon Bellows, MD sent at 04/26/2021  7:47 AM EDT ----- ?Needs hep a vaccine ?

## 2021-05-11 ENCOUNTER — Ambulatory Visit (INDEPENDENT_AMBULATORY_CARE_PROVIDER_SITE_OTHER): Payer: Medicaid Other | Admitting: Gastroenterology

## 2021-05-11 VITALS — BP 113/76 | HR 86

## 2021-05-11 DIAGNOSIS — Z23 Encounter for immunization: Secondary | ICD-10-CM

## 2021-05-11 NOTE — Progress Notes (Signed)
Hepatitis A vaccination received this morning.  Administered to Right Deltoid IM Injection.Tolerated without difficulty. ? ?Lot# [id number] ?Exp 01/12/23 ?

## 2021-05-13 ENCOUNTER — Encounter: Payer: Self-pay | Admitting: Gastroenterology

## 2021-05-16 ENCOUNTER — Telehealth (INDEPENDENT_AMBULATORY_CARE_PROVIDER_SITE_OTHER): Payer: Medicaid Other | Admitting: Gastroenterology

## 2021-05-16 ENCOUNTER — Other Ambulatory Visit: Payer: Self-pay

## 2021-05-16 DIAGNOSIS — R109 Unspecified abdominal pain: Secondary | ICD-10-CM

## 2021-05-16 DIAGNOSIS — F1021 Alcohol dependence, in remission: Secondary | ICD-10-CM

## 2021-05-16 DIAGNOSIS — Z8719 Personal history of other diseases of the digestive system: Secondary | ICD-10-CM

## 2021-05-16 DIAGNOSIS — K7010 Alcoholic hepatitis without ascites: Secondary | ICD-10-CM

## 2021-05-16 NOTE — Progress Notes (Signed)
?  ?Wyline Mood , MD ?[address]  ?[address]  ?[place], [place] [address]  ?Main: [phone]  ?Fax: [phone] ? ? ?Primary Care Physician: Pcp, No ? ?Virtual Visit via Video Note ? ?I connected with patient on 05/16/21 at  1:15 PM EDT by video and verified that I am speaking with the correct person using two identifiers. ?  ?I discussed the limitations, risks, security and privacy concerns of performing an evaluation and management service by video  and the availability of in person appointments. I also discussed with the patient that there may be a patient responsible charge related to this service. The patient expressed understanding and agreed to proceed. ? ?Location of Patient: Home ?Location of Provider: Home ?Persons involved: Patient and provider only ? ? ?History of Present Illness: ?Chief Complaint  ?Patient presents with  ? abdnominal pain  ? ? ?HPI: Gina Price is a 31 y.o. female ? ? ?Summary of history : ?  ?She is here today to see me for hospital follow-up.  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  Did not require steroids as she did not meet the criteria.  No evidence of portal vein thrombosis on liver Doppler. ?  ?03/19/2021 ultrasound of the liver Doppler showed no portal vein thrombosis.  Right upper quadrant ultrasound showed CBD 6 mm hepatic steatosis. ?  ?She underwent complete autoimmune and viral hepatitis screen showed only an elevated ferritin of 2900 with iron percentage saturation of 98% otherwise EBV CMV HSV VZV hepatitis B and C serology were negative.  HIV negative ceruloplasmin was low.  AST and ALT were greater than 300 with a total bilirubin of 2.1 and INR of 1.5 ? ?03/29/2021 LFTs have almost normalized significant improvement albumin 3.8. ?  ?Interval history  04/25/2021-05/16/2021 ? ?She states that since last visit she has been having abdominal pain.  Lower abdominal on and off not  precipitated by eating denies any NSAID use having daily bowel movements on Linzess denies any constipation.  Has stopped all alcohol consumption. ? ? ? ?Current Outpatient Medications  ?Medication Sig Dispense Refill  ? ADDERALL XR 25 MG 24 hr capsule Take 1 capsule by mouth every morning.    ? amphetamine-dextroamphetamine (ADDERALL) 20 MG tablet Take 25 mg by mouth daily.    ? atorvastatin (LIPITOR) 10 MG tablet Take 10 mg by mouth daily.    ? busPIRone (BUSPAR) 7.5 MG tablet Take 7.5 mg by mouth 3 (three) times daily.    ? dicyclomine (BENTYL) 20 MG tablet Take 1 tablet (20 mg total) by mouth 4 (four) times daily -  before meals and at bedtime for 5 days. 20 tablet 0  ? gabapentin (NEURONTIN) 600 MG tablet Take 600 mg by mouth 3 (three) times daily.    ? LINZESS 72 MCG capsule Take 72 mcg by mouth daily. (Patient not taking: Reported on 04/25/2021)    ? lisinopril (ZESTRIL) 5 MG tablet Take 5 mg by mouth every morning.    ? lithium 600 MG capsule Take 600 mg by mouth at bedtime.    ? meloxicam (MOBIC) 15 MG tablet Take 1 tablet (15 mg total) by mouth daily. 15 tablet 0  ? omeprazole (PRILOSEC) 40 MG capsule Take 1 capsule (40 mg total) by mouth daily. 90 capsule 3  ? promethazine (PHENERGAN) 12.5 MG tablet Take 1 tablet (12.5 mg total) by mouth every 6 (six) hours as needed for  nausea or vomiting. 16 tablet 0  ? propranolol (INDERAL) 10 MG tablet Take 10 mg by mouth 2 (two) times daily.    ? ?No current facility-administered medications for this visit.  ? ? ?Allergies as of 05/16/2021 - Review Complete 04/25/2021  ?Allergen Reaction Noted  ? Albuterol Shortness Of Breath and Anaphylaxis 04/14/2020  ? Penicillins Anaphylaxis 08/26/2015  ? ? ?Review of Systems:    ?All systems reviewed and negative except where noted in HPI.  ?General Appearance:    Alert, cooperative, no distress, appears stated age  ?Head:    Normocephalic, without obvious abnormality, atraumatic  ?Eyes:    PERRL, conjunctiva/corneas clear,   ?Ears:    Grossly normal hearing   ? ?Neurologic:  Grossly normal   ? ?Observations/Objective: ? ?Labs: ?CMP  ?   ?Component Value Date/Time  ? NA 133 (L) 04/11/2021 1132  ? K 3.3 (L) 04/11/2021 1132  ? CL 94 (L) 04/11/2021 1132  ? CO2 28 04/11/2021 1132  ? GLUCOSE 96 04/11/2021 1132  ? BUN 13 04/11/2021 1132  ? CREATININE 0.70 04/11/2021 1132  ? CALCIUM 9.4 04/11/2021 1132  ? PROT 6.9 04/25/2021 1522  ? ALBUMIN 4.6 04/25/2021 1522  ? AST 36 04/25/2021 1522  ? ALT 26 04/25/2021 1522  ? ALKPHOS 75 04/25/2021 1522  ? BILITOT 0.4 04/25/2021 1522  ? GFRNONAA >60 04/11/2021 1132  ? GFRAA >60 05/01/2019 1619  ? ?Lab Results  ?Component Value Date  ? WBC 9.1 04/11/2021  ? HGB 12.1 04/11/2021  ? HCT 36.7 04/11/2021  ? MCV 97.1 04/11/2021  ? PLT 308 04/11/2021  ? ? ?Imaging Studies: ?No results found. ? ?Assessment and Plan:  ? ?Gina Price is a 31 y.o. 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  Since hospitalization she has stopped all alcohol consumption and LFTs are normalized.  On admission she had acute liver failure that has also resolved.  Presently her only issue is lower abdominal pain.  Initially I thought it was secondary to constipation but despite treating with Linzess and having adequate good bowel movements continues to have the pain. ?  ?Plan ?1.  Continue alcohol cessation ?2.  Continue Prilosec ?3.  EGD this week ?4.  CT abdomen and pelvis. ? ?I have discussed alternative options, risks & benefits,  which include, but are not limited to, bleeding, infection, perforation,respiratory complication & drug reaction.  The patient agrees with this plan & written consent will be obtained.   ? ? ? ? ?  ?I discussed the assessment and treatment plan with the patient. The patient was provided an opportunity to ask questions and all were answered. The patient agreed with the plan and demonstrated an understanding of the instructions. ?   ?The patient was advised to call back or seek an in-person evaluation if the symptoms worsen or if the condition fails to improve as anticipated. ? ?I provided 15 minutes of face-to-face time during this encounter. ? ?Dr Wyline Mood MD,MRCP [facility]) ?Gastroenterology/Hepatology ?Pager: [phone] ? ? ?Speech recognition software was used to dictate this note.   ?

## 2021-05-17 ENCOUNTER — Other Ambulatory Visit: Payer: Self-pay

## 2021-05-17 DIAGNOSIS — R1030 Lower abdominal pain, unspecified: Secondary | ICD-10-CM

## 2021-05-17 DIAGNOSIS — Z8719 Personal history of other diseases of the digestive system: Secondary | ICD-10-CM

## 2021-05-18 ENCOUNTER — Ambulatory Visit
Admission: RE | Admit: 2021-05-18 | Discharge: 2021-05-18 | Disposition: A | Discharge status: Home / Self Care | Payer: Medicaid Other | Attending: Gastroenterology | Admitting: Gastroenterology

## 2021-05-18 ENCOUNTER — Ambulatory Visit: Payer: Medicaid Other | Admitting: Anesthesiology

## 2021-05-18 ENCOUNTER — Encounter
Admission: RE | Disposition: A | Discharge status: Home / Self Care | Payer: Self-pay | Source: Home / Self Care | Attending: Gastroenterology

## 2021-05-18 ENCOUNTER — Encounter: Payer: Self-pay | Admitting: Gastroenterology

## 2021-05-18 DIAGNOSIS — Z79899 Other long term (current) drug therapy: Secondary | ICD-10-CM | POA: Diagnosis not present

## 2021-05-18 DIAGNOSIS — F32A Depression, unspecified: Secondary | ICD-10-CM | POA: Insufficient documentation

## 2021-05-18 DIAGNOSIS — I10 Essential (primary) hypertension: Secondary | ICD-10-CM | POA: Insufficient documentation

## 2021-05-18 DIAGNOSIS — R109 Unspecified abdominal pain: Secondary | ICD-10-CM | POA: Insufficient documentation

## 2021-05-18 DIAGNOSIS — F419 Anxiety disorder, unspecified: Secondary | ICD-10-CM | POA: Insufficient documentation

## 2021-05-18 DIAGNOSIS — K3189 Other diseases of stomach and duodenum: Secondary | ICD-10-CM | POA: Diagnosis not present

## 2021-05-18 DIAGNOSIS — F909 Attention-deficit hyperactivity disorder, unspecified type: Secondary | ICD-10-CM | POA: Insufficient documentation

## 2021-05-18 DIAGNOSIS — Z8719 Personal history of other diseases of the digestive system: Secondary | ICD-10-CM

## 2021-05-18 HISTORY — PX: ESOPHAGOGASTRODUODENOSCOPY (EGD) WITH PROPOFOL: SHX5813

## 2021-05-18 HISTORY — DX: Essential (primary) hypertension: I10

## 2021-05-18 LAB — POCT PREGNANCY, URINE: Preg Test, Ur: NEGATIVE

## 2021-05-18 SURGERY — ESOPHAGOGASTRODUODENOSCOPY (EGD) WITH PROPOFOL
Anesthesia: General

## 2021-05-18 MED ORDER — PROPOFOL 10 MG/ML IV BOLUS
INTRAVENOUS | Status: DC | PRN
  Administered 2021-05-18 (×2): 20 mg via INTRAVENOUS
  Administered 2021-05-18: 60 mg via INTRAVENOUS

## 2021-05-18 MED ORDER — DEXMEDETOMIDINE (PRECEDEX) IN NS 20 MCG/5ML (4 MCG/ML) IV SYRINGE
PREFILLED_SYRINGE | INTRAVENOUS | Status: DC | PRN
  Administered 2021-05-18: 4 ug via INTRAVENOUS
  Administered 2021-05-18: 8 ug via INTRAVENOUS
  Administered 2021-05-18: 4 ug via INTRAVENOUS

## 2021-05-18 MED ORDER — SODIUM CHLORIDE 0.9 % IV SOLN: INTRAVENOUS | Status: DC

## 2021-05-18 MED ORDER — DEXMEDETOMIDINE HCL IN NACL 80 MCG/20ML IV SOLN
INTRAVENOUS | Status: AC
  Filled 2021-05-18: qty 20

## 2021-05-18 MED ORDER — LIDOCAINE HCL (CARDIAC) PF 100 MG/5ML IV SOSY
PREFILLED_SYRINGE | INTRAVENOUS | Status: DC | PRN
  Administered 2021-05-18: 50 mg via INTRAVENOUS

## 2021-05-18 MED ORDER — PROPOFOL 10 MG/ML IV BOLUS
INTRAVENOUS | Status: AC
  Filled 2021-05-18: qty 20
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
## 2021-05-18 NOTE — Transfer of Care (Signed)
Immediate Anesthesia Transfer of Care Note ? ?Patient: Gina Price ? ?Procedure(s) Performed: ESOPHAGOGASTRODUODENOSCOPY (EGD) WITH PROPOFOL ? ?Patient Location: PACU and Endoscopy Unit ? ?Anesthesia Type:General ? ?Level of Consciousness: awake, drowsy and patient cooperative ? ?Airway & Oxygen Therapy: Patient Spontanous Breathing ? ?Post-op Assessment: Report given to RN and Post -op Vital signs reviewed and stable ? ?Post vital signs: Reviewed and stable ? ?Last Vitals:  ?Vitals Value Taken Time  ?BP 96/64 05/18/21 0942  ?Temp 36.2 ?C 05/18/21 0941  ?Pulse 97 05/18/21 0942  ?Resp 14 05/18/21 0942  ?SpO2 100 % 05/18/21 0942  ?Vitals shown include unvalidated device data. ? ?Last Pain:  ?Vitals:  ? 05/18/21 0941  ?TempSrc: Tympanic  ?PainSc: Asleep  ?   ? ?  ? ?Complications: No notable events documented. ?

## 2021-05-18 NOTE — Anesthesia Procedure Notes (Signed)
Procedure Name: [place] ?Date/Time: 05/18/2021 9:30 AM ?Performed by: Jerrye Noble, CRNA ?Pre-anesthesia Checklist: Patient identified, Emergency Drugs available, Suction available and Patient being monitored ?Patient Re-evaluated:Patient Re-evaluated prior to induction ?Oxygen Delivery Method: Nasal cannula ? ? ? ? ?

## 2021-05-18 NOTE — Anesthesia Postprocedure Evaluation (Signed)
Anesthesia Post Note ? ?Patient: Gina Price ? ?Procedure(s) Performed: ESOPHAGOGASTRODUODENOSCOPY (EGD) WITH PROPOFOL ? ?Patient location during evaluation: Endoscopy ?Anesthesia Type: General ?Level of consciousness: awake and alert ?Pain management: pain level controlled ?Vital Signs Assessment: post-procedure vital signs reviewed and stable ?Respiratory status: spontaneous breathing, nonlabored ventilation, respiratory function stable and patient connected to nasal cannula oxygen ?Cardiovascular status: blood pressure returned to baseline and stable ?Postop Assessment: no apparent nausea or vomiting ?Anesthetic complications: no ? ? ?No notable events documented. ? ? ?Last Vitals:  ?Vitals:  ? 05/18/21 0951 05/18/21 1001  ?BP: 97/62 101/71  ?Pulse: 91 75  ?Resp: 16 15  ?Temp:    ?SpO2: 100% 100%  ?  ?Last Pain:  ?Vitals:  ? 05/18/21 1001  ?TempSrc:   ?PainSc: 0-No pain  ? ? ?  ?  ?  ?  ?  ?  ? ?Cleda Mccreedy Kamar Callender ? ? ? ? ?

## 2021-05-18 NOTE — Op Note (Signed)
[facility] ?Gastroenterology ?Patient Name: Gina Price ?Procedure Date: 05/18/2021 9:21 AM ?MRN: [id number] ?Account #: [id number] ?Date of Birth: [birth year] ?Admit Type: Outpatient ?Age: 31 ?Room: [facility] ENDO ROOM 3 ?Gender: Female ?Note Status: Finalized ?Instrument Name: Upper Endoscope [id number] ?Procedure:             Upper GI endoscopy ?Indications:           Abdominal pain ?Providers:             Wyline Mood MD, MD ?Referring MD:          Sallye Lat Md, MD (Referring MD) ?Medicines:             Monitored Anesthesia Care ?Complications:         No immediate complications. ?Procedure:             Pre-Anesthesia Assessment: ?                       - Prior to the procedure, a History and Physical was  ?                       performed, and patient medications, allergies and  ?                       sensitivities were reviewed. The patient's tolerance  ?                       of previous anesthesia was reviewed. ?                       - The risks and benefits of the procedure and the  ?                       sedation options and risks were discussed with the  ?                       patient. All questions were answered and informed  ?                       consent was obtained. ?                       - ASA Grade Assessment: II - A patient with mild  ?                       systemic disease. ?                       After obtaining informed consent, the endoscope was  ?                       passed under direct vision. Throughout the procedure,  ?                       the patient's blood pressure, pulse, and oxygen  ?                       saturations were monitored continuously. The Endoscope  ?                       was  introduced through the mouth, and advanced to the  ?                       third part of duodenum. The upper GI endoscopy was  ?                       accomplished with ease. The patient tolerated the  ?                       procedure well. ?Findings: ?     The esophagus was  normal. ?     The examined duodenum was normal. ?     The cardia and gastric fundus were normal on retroflexion. ?     Patchy mild mucosal changes characterized by congestion, discoloration,  ?     erythema and a decreased vascular pattern were found on the greater  ?     curvature of the stomach, in the gastric antrum and in the prepyloric  ?     region of the stomach. Biopsies were taken with a cold forceps for  ?     histology. ?Impression:            - Normal esophagus. ?                       - Normal examined duodenum. ?                       - Congested, discolored, erythematous and decreased  ?                       vascular pattern mucosa in the greater curvature,  ?                       antrum and prepyloric region of the stomach. Biopsied. ?Recommendation:        - Discharge patient to home (with escort). ?                       - Resume previous diet. ?                       - Continue present medications. ?                       - Await pathology results. ?                       - Return to my office in 6 weeks. ?Procedure Code(s):     --- Professional --- ?                       [phone], Esophagogastroduodenoscopy, flexible,  ?                       transoral; with biopsy, single or multiple ?Diagnosis Code(s):     --- Professional --- ?                       K31.89, Other diseases of stomach and duodenum ?                       R10.9, Unspecified abdominal pain ?CPT copyright  2019 [employer] Association. All rights reserved. ?The codes documented in this report are preliminary and upon coder review may  ?be revised to meet current compliance requirements. ?Wyline Mood, MD ?Wyline Mood MD, MD ?05/18/2021 9:38:29 AM ?This report has been signed electronically. ?Number of Addenda: 0 ?Note Initiated On: 05/18/2021 9:21 AM ?Estimated Blood Loss:  Estimated blood loss: none. ?     [facility] ?

## 2021-05-19 ENCOUNTER — Encounter: Payer: Self-pay | Admitting: Gastroenterology

## 2021-05-19 LAB — SURGICAL PATHOLOGY

## 2021-05-20 ENCOUNTER — Encounter: Payer: Self-pay | Admitting: Gastroenterology

## 2021-05-25 ENCOUNTER — Ambulatory Visit
Admission: RE | Admit: 2021-05-25 | Discharge: 2021-05-25 | Disposition: A | Discharge status: Home / Self Care | Payer: Medicaid Other | Source: Ambulatory Visit | Attending: Gastroenterology | Admitting: Gastroenterology

## 2021-05-25 DIAGNOSIS — R1030 Lower abdominal pain, unspecified: Secondary | ICD-10-CM | POA: Insufficient documentation

## 2021-05-25 MED ORDER — IOHEXOL 300 MG/ML  SOLN
100.0000 mL | Freq: Once | INTRAMUSCULAR | Status: AC | PRN
  Administered 2021-05-25: 100 mL via INTRAVENOUS

## 2021-05-26 ENCOUNTER — Telehealth: Payer: Self-pay

## 2021-05-26 NOTE — Telephone Encounter (Signed)
-----   Message from Wyline Mood, MD sent at 05/26/2021  1:24 PM EDT ----- ?Inform patient that the CT scan shows constipation.  A lot of stool present in the colon.  This may be the reason why she has abdominal symptoms.  Commence on MiraLAX 1 capful twice a day.  If no response to let us know we will put her on something d ?ifferent ?

## 2021-05-26 NOTE — Progress Notes (Signed)
Inform patient that the CT scan shows constipation.  A lot of stool present in the colon.  This may be the reason why she has abdominal symptoms.  Commence on MiraLAX 1 capful twice a day.  If no response to let us know we will put her on something different

## 2021-05-26 NOTE — Telephone Encounter (Signed)
Called patient to let her know about her CT Scan results and she stated that she did not know why her scan showed that she was constipated. She stated that since 2021 she has been taking Linzess 72 mcg daily and has a bowel movement twice a day. She stated that she continues to have the lower abdominal pain. Therefore, she would like to know what she will need to do for her pain. ?

## 2021-05-27 MED ORDER — LINACLOTIDE 145 MCG PO CAPS: 145.0000 ug | ORAL_CAPSULE | Freq: Every day | ORAL | 3 refills | Status: AC

## 2021-05-27 NOTE — Telephone Encounter (Signed)
Called patient back but had to leave her a detailed message on her voicemail. Patient was instructed to take Linzess 145 mcg a day. I tol dher to call us back if she had any questions. ?

## 2021-06-18 ENCOUNTER — Encounter: Payer: Self-pay | Admitting: Emergency Medicine

## 2021-06-18 ENCOUNTER — Emergency Department
Admission: EM | Admit: 2021-06-18 | Discharge: 2021-06-18 | Disposition: A | Discharge status: Home / Self Care | Payer: Medicaid Other | Attending: Emergency Medicine | Admitting: Emergency Medicine

## 2021-06-18 ENCOUNTER — Other Ambulatory Visit: Payer: Self-pay

## 2021-06-18 DIAGNOSIS — R202 Paresthesia of skin: Secondary | ICD-10-CM | POA: Diagnosis not present

## 2021-06-18 DIAGNOSIS — R059 Cough, unspecified: Secondary | ICD-10-CM | POA: Diagnosis present

## 2021-06-18 DIAGNOSIS — B349 Viral infection, unspecified: Secondary | ICD-10-CM | POA: Insufficient documentation

## 2021-06-18 DIAGNOSIS — Z20822 Contact with and (suspected) exposure to covid-19: Secondary | ICD-10-CM | POA: Insufficient documentation

## 2021-06-18 HISTORY — DX: Alcoholic hepatitis without ascites: K70.10

## 2021-06-18 HISTORY — DX: Alcohol abuse, uncomplicated: F10.10

## 2021-06-18 LAB — RESP PANEL BY RT-PCR (FLU A&B, COVID) ARPGX2
Influenza A by PCR: NEGATIVE
Influenza B by PCR: NEGATIVE
SARS Coronavirus 2 by RT PCR: NEGATIVE

## 2021-06-18 LAB — CBC
HCT: 41.9 % (ref 36.0–46.0)
Hemoglobin: 13.9 g/dL (ref 12.0–15.0)
MCH: 29.3 pg (ref 26.0–34.0)
MCHC: 33.2 g/dL (ref 30.0–36.0)
MCV: 88.4 fL (ref 80.0–100.0)
Platelets: 432 10*3/uL — ABNORMAL HIGH (ref 150–400)
RBC: 4.74 MIL/uL (ref 3.87–5.11)
RDW: 13.1 % (ref 11.5–15.5)
WBC: 6.6 10*3/uL (ref 4.0–10.5)
nRBC: 0 % (ref 0.0–0.2)

## 2021-06-18 LAB — COMPREHENSIVE METABOLIC PANEL
ALT: 51 U/L — ABNORMAL HIGH (ref 0–44)
AST: 48 U/L — ABNORMAL HIGH (ref 15–41)
Albumin: 3.9 g/dL (ref 3.5–5.0)
Alkaline Phosphatase: 66 U/L (ref 38–126)
Anion gap: 9 (ref 5–15)
BUN: 14 mg/dL (ref 6–20)
CO2: 22 mmol/L (ref 22–32)
Calcium: 8.5 mg/dL — ABNORMAL LOW (ref 8.9–10.3)
Chloride: 107 mmol/L (ref 98–111)
Creatinine, Ser: 0.65 mg/dL (ref 0.44–1.00)
GFR, Estimated: >60 mL/min (ref >60)
Glucose, Bld: 108 mg/dL — ABNORMAL HIGH (ref 70–99)
Potassium: 4.1 mmol/L (ref 3.5–5.1)
Sodium: 138 mmol/L (ref 135–145)
Total Bilirubin: 0.5 mg/dL (ref 0.3–1.2)
Total Protein: 6.7 g/dL (ref 6.5–8.1)

## 2021-06-18 MED ORDER — ONDANSETRON 4 MG PO TBDP: 4.0000 mg | ORAL_TABLET | Freq: Three times a day (TID) | ORAL | 0 refills | Status: DC | PRN

## 2021-06-18 MED ORDER — PREDNISONE 20 MG PO TABS
60.0000 mg | ORAL_TABLET | Freq: Once | ORAL | Status: AC
Start: 2021-06-18 — End: 2021-06-18
  Administered 2021-06-18: 60 mg via ORAL
  Filled 2021-06-18: qty 3

## 2021-06-18 MED ORDER — NAPROXEN 500 MG PO TABS
500.0000 mg | ORAL_TABLET | Freq: Once | ORAL | Status: AC
  Administered 2021-06-18: 500 mg via ORAL
  Filled 2021-06-18: qty 1

## 2021-06-18 MED ORDER — ONDANSETRON 4 MG PO TBDP
8.0000 mg | ORAL_TABLET | Freq: Once | ORAL | Status: DC
  Filled 2021-06-18: qty 2

## 2021-06-18 MED ORDER — NAPROXEN 500 MG PO TABS: 500.0000 mg | ORAL_TABLET | Freq: Two times a day (BID) | ORAL | 0 refills | Status: AC

## 2021-06-18 NOTE — ED Triage Notes (Signed)
Pt via POV from home. Pt c/o bilateral hand and feet tingling, states that she feels like there are needles in her hands and feet. States she also have some generalized body ache. Pt states that it has been going on the past 2-3 days. Pt also endorses diarrhea. Denies NV. Denies fever. Pt has a hx of cirrhosis. Pt states "whenever my heart beats I can feel the pain more, the pain is throbbing." Pt is A&Ox4 and NAD

## 2021-06-18 NOTE — ED Provider Notes (Signed)
[facility] Provider Note    Event Date/Time   First MD Initiated Contact with Patient 06/18/21 1619     (approximate)   History   Chief Complaint: Numbness   HPI  Gina Price is a 31 y.o. female with no significant past medical history who comes the ED complaining of generalized body aches, nausea, diarrhea, nonproductive cough, and bilateral hand and feet tingling.  This all started gradually about 2 or 3 days ago, constant, waxing waning, no aggravating or alleviating factors.  Her son had a viral illness about a week ago.  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  Denies any chest pain or shortness of breath.  No focal abdominal pain.  She does also complain of eye redness and itching.   Physical Exam   Triage Vital Signs: ED Triage Vitals  Enc Vitals Group     BP 06/18/21 1343 (!) 115/93     Pulse Rate 06/18/21 1343 (!) 103     Resp 06/18/21 1343 20     Temp 06/18/21 1343 98.7 F (37.1 C)     Temp Source 06/18/21 1343 Oral     SpO2 06/18/21 1343 95 %     Weight 06/18/21 1344 140 lb (63.5 kg)     Height 06/18/21 1344 5' (1.524 m)     Head Circumference --      Peak Flow --      Pain Score 06/18/21 1344 6     Pain Loc --      Pain Edu? --      Excl. in [place]? --     Most recent vital signs: Vitals:   06/18/21 1343  BP: (!) 115/93  Pulse: (!) 103  Resp: 20  Temp: 98.7 F (37.1 C)  SpO2: 95%    General: Awake, no distress.  CV:  Good peripheral perfusion.  Regular rate and rhythm Resp:  Normal effort.  Clear to auscultation bilaterally Abd:  No distention.  Soft, no focal tenderness.  No rebound rigidity or guarding Other:  No rash.  No lower extremity edema or calf tenderness.  Thyroid nonpalpable.  Eyes show bilateral hazy conjunctivitis consistent with viral illness.  PERRL, EOMI.   ED Results / Procedures / Treatments   Labs (all labs ordered are listed, but only abnormal results are displayed) Labs  Reviewed  CBC - Abnormal; Notable for the following components:      Result Value   Platelets 432 (*)    All other components within normal limits  COMPREHENSIVE METABOLIC PANEL - Abnormal; Notable for the following components:   Glucose, Bld 108 (*)    Calcium 8.5 (*)    AST 48 (*)    ALT 51 (*)    All other components within normal limits  RESP PANEL BY RT-PCR (FLU A&B, COVID) [id number]     EKG    RADIOLOGY    PROCEDURES:  Procedures   MEDICATIONS ORDERED IN ED: Medications  ondansetron (ZOFRAN-ODT) disintegrating tablet 8 mg (has no administration in time range)  predniSONE (DELTASONE) tablet 60 mg (has no administration in time range)  naproxen (NAPROSYN) tablet 500 mg (has no administration in time range)     IMPRESSION / MDM / ASSESSMENT AND PLAN / ED COURSE  I reviewed the triage vital signs and the nursing notes.  Differential diagnosis includes, but is not limited to, viral illness, anemia, ITP, renal insufficiency, electrolyte abnormality  Patient's presentation is most consistent with acute illness / injury with system symptoms.  Patient presents with multiple complaints, most consistent with a viral illness, possibly COVID.  Will obtain a COVID swab while getting prednisone and NSAIDs and Zofran for nausea relief.  Vital signs are unremarkable except for mild tachycardia which I think is due to a degree of dehydration and anxiousness.  At rest on my exam heart rate is improved and normal.  Rest of the exam is benign and reassuring.  Serum labs are normal.  I think the patient will not require admission and can be discharged home with instructions for managing viral illness and maintaining hydration, with or without Paxlovid prescription as indicated by COVID swab result.       FINAL CLINICAL IMPRESSION(S) / ED DIAGNOSES   Final diagnoses:  Acute viral syndrome     Rx / DC Orders   ED Discharge Orders     None         Note:  This document was prepared using Dragon voice recognition software and may include unintentional dictation errors.   Sharman Cheek, MD 06/18/21 [phone]

## 2021-06-18 NOTE — Discharge Instructions (Addendum)
Your COVID and flu test today are negative.  Continue taking anti-inflammatory medicine at home along with nausea medicine as needed to control your symptoms.  Focus on staying well-hydrated and allow yourself plenty of rest.

## 2021-06-22 ENCOUNTER — Emergency Department: Payer: Medicaid Other

## 2021-06-22 ENCOUNTER — Other Ambulatory Visit: Payer: Self-pay

## 2021-06-22 ENCOUNTER — Emergency Department
Admission: EM | Admit: 2021-06-22 | Discharge: 2021-06-22 | Disposition: A | Discharge status: Home / Self Care | Payer: Medicaid Other | Attending: Student in an Organized Health Care Education/Training Program | Admitting: Student in an Organized Health Care Education/Training Program

## 2021-06-22 DIAGNOSIS — I10 Essential (primary) hypertension: Secondary | ICD-10-CM | POA: Diagnosis not present

## 2021-06-22 DIAGNOSIS — E860 Dehydration: Secondary | ICD-10-CM | POA: Diagnosis not present

## 2021-06-22 DIAGNOSIS — R1010 Upper abdominal pain, unspecified: Secondary | ICD-10-CM | POA: Insufficient documentation

## 2021-06-22 DIAGNOSIS — R202 Paresthesia of skin: Secondary | ICD-10-CM

## 2021-06-22 DIAGNOSIS — D72829 Elevated white blood cell count, unspecified: Secondary | ICD-10-CM | POA: Insufficient documentation

## 2021-06-22 DIAGNOSIS — G8929 Other chronic pain: Secondary | ICD-10-CM

## 2021-06-22 LAB — CBC WITH DIFFERENTIAL/PLATELET
Abs Immature Granulocytes: 0.03 10*3/uL (ref 0.00–0.07)
Basophils Absolute: 0 10*3/uL (ref 0.0–0.1)
Basophils Relative: 0 %
Eosinophils Absolute: 0 10*3/uL (ref 0.0–0.5)
Eosinophils Relative: 0 %
HCT: 43.5 % (ref 36.0–46.0)
Hemoglobin: 14.2 g/dL (ref 12.0–15.0)
Immature Granulocytes: 0 %
Lymphocytes Relative: 31 %
Lymphs Abs: 3.6 10*3/uL (ref 0.7–4.0)
MCH: 29.4 pg (ref 26.0–34.0)
MCHC: 32.6 g/dL (ref 30.0–36.0)
MCV: 90.1 fL (ref 80.0–100.0)
Monocytes Absolute: 0.5 10*3/uL (ref 0.1–1.0)
Monocytes Relative: 5 %
Neutro Abs: 7.3 10*3/uL (ref 1.7–7.7)
Neutrophils Relative %: 64 %
Platelets: 410 10*3/uL — ABNORMAL HIGH (ref 150–400)
RBC: 4.83 MIL/uL (ref 3.87–5.11)
RDW: 13.6 % (ref 11.5–15.5)
WBC: 11.6 10*3/uL — ABNORMAL HIGH (ref 4.0–10.5)
nRBC: 0 % (ref 0.0–0.2)

## 2021-06-22 LAB — LIPASE, BLOOD: Lipase: 30 U/L (ref 11–51)

## 2021-06-22 LAB — COMPREHENSIVE METABOLIC PANEL
ALT: 30 U/L (ref 0–44)
AST: 33 U/L (ref 15–41)
Albumin: 4.7 g/dL (ref 3.5–5.0)
Alkaline Phosphatase: 76 U/L (ref 38–126)
Anion gap: 11 (ref 5–15)
BUN: 24 mg/dL — ABNORMAL HIGH (ref 6–20)
CO2: 23 mmol/L (ref 22–32)
Calcium: 9.8 mg/dL (ref 8.9–10.3)
Chloride: 101 mmol/L (ref 98–111)
Creatinine, Ser: 0.77 mg/dL (ref 0.44–1.00)
GFR, Estimated: >60 mL/min (ref >60)
Glucose, Bld: 108 mg/dL — ABNORMAL HIGH (ref 70–99)
Potassium: 3.9 mmol/L (ref 3.5–5.1)
Sodium: 135 mmol/L (ref 135–145)
Total Bilirubin: 1.3 mg/dL — ABNORMAL HIGH (ref 0.3–1.2)
Total Protein: 7.6 g/dL (ref 6.5–8.1)

## 2021-06-22 LAB — POC URINE PREG, ED: Preg Test, Ur: NEGATIVE

## 2021-06-22 LAB — URINALYSIS, ROUTINE W REFLEX MICROSCOPIC
Bilirubin Urine: NEGATIVE
Glucose, UA: NEGATIVE mg/dL
Hgb urine dipstick: NEGATIVE
Ketones, ur: 5 mg/dL — AB
Leukocytes,Ua: NEGATIVE
Nitrite: NEGATIVE
Protein, ur: NEGATIVE mg/dL
Specific Gravity, Urine: 1.033 — ABNORMAL HIGH (ref 1.005–1.030)
pH: 5 (ref 5.0–8.0)

## 2021-06-22 LAB — MAGNESIUM: Magnesium: 2.2 mg/dL (ref 1.7–2.4)

## 2021-06-22 LAB — TSH: TSH: 2.482 u[IU]/mL (ref 0.350–4.500)

## 2021-06-22 MED ORDER — DROPERIDOL 2.5 MG/ML IJ SOLN
2.5000 mg | Freq: Once | INTRAMUSCULAR | Status: AC
  Administered 2021-06-22: 2.5 mg via INTRAVENOUS
  Filled 2021-06-22: qty 2

## 2021-06-22 MED ORDER — SODIUM CHLORIDE 0.9 % IV BOLUS
1000.0000 mL | Freq: Once | INTRAVENOUS | Status: AC
  Administered 2021-06-22: 1000 mL via INTRAVENOUS

## 2021-06-22 NOTE — ED Triage Notes (Signed)
Pt comes from [facility] with c/o slurred speech and twitching. Pt went over to [facility] for belly pain. Pt states this started hour ago the twitching and shaking.  MD assessed pt in triage.  Pt speaking in complete sentences at this time but is shakey and keeps jumping and twitiching

## 2021-06-22 NOTE — ED Notes (Addendum)
MD brought to triage to assess pt. No activation of stroke at this time.

## 2021-06-22 NOTE — ED Notes (Signed)
Pt given warm blankets and lights turned off. Pt resting.

## 2021-06-22 NOTE — ED Notes (Signed)
Taken to xray.

## 2021-06-22 NOTE — ED Provider Notes (Signed)
[facility] Provider Note    Event Date/Time   First MD Initiated Contact with Patient 06/22/21 1254     (approximate)   History   Chief Complaint Body twitching   HPI  Gina Price is a 31 y.o. female with past medical history of hypertension and alcohol abuse who presents to the ED complaining of body twitching.  Patient reports that she has been dealing with pain in her upper abdomen for the past couple of days constantly with nausea and poor p.o. intake.  She has not vomited and denies any diarrhea, does state that she has been dealing with intermittent tingling in both arms as well as slurred speech.  Slurred speech and tingling seem to worsen earlier this morning, were associated with crampy discomfort in both eyes.  She sought care in [place] [facility] but was referred to the ED for further evaluation.  She denies any vision changes or weakness in her extremities, numbness and tingling affects both arms equally.  She has not had any fevers, cough, chest pain, shortness of breath, dysuria, hematuria, or flank pain.  She reports history of similar stomach issues in the past recently had CT scan and endoscopy with GI.  She denies recent alcohol abuse and denies any drug use.      Physical Exam   Triage Vital Signs: ED Triage Vitals  Enc Vitals Group     BP 06/22/21 1100 (!) 157/102     Pulse Rate 06/22/21 1100 (!) 135     Resp --      Temp 06/22/21 1100 98.6 F (37 C)     Temp Source 06/22/21 1100 Oral     SpO2 06/22/21 1100 95 %     Weight --      Height --      Head Circumference --      Peak Flow --      Pain Score 06/22/21 1107 6     Pain Loc --      Pain Edu? --      Excl. in [place]? --     Most recent vital signs: Vitals:   06/22/21 1330 06/22/21 1345  BP: 113/83   Pulse: 89 79  Resp: 20 15  Temp:    SpO2: 100% 100%    Constitutional: Alert and oriented. Eyes: Conjunctivae are normal. Head: Atraumatic. Nose: No  congestion/rhinnorhea. Mouth/Throat: Mucous membranes are moist.  Cardiovascular: Normal rate, regular rhythm. Grossly normal heart sounds.  2+ radial pulses bilaterally. Respiratory: Normal respiratory effort.  No retractions. Lungs CTAB. Gastrointestinal: Soft and nontender. No distention. Musculoskeletal: No lower extremity tenderness nor edema.  Neurologic:  Normal speech and language. No gross focal neurologic deficits are appreciated.  Tremulous with stuttering speech pattern.    ED Results / Procedures / Treatments   Labs (all labs ordered are listed, but only abnormal results are displayed) Labs Reviewed  CBC WITH DIFFERENTIAL/PLATELET - Abnormal; Notable for the following components:      Result Value   WBC 11.6 (*)    Platelets 410 (*)    All other components within normal limits  COMPREHENSIVE METABOLIC PANEL - Abnormal; Notable for the following components:   Glucose, Bld 108 (*)    BUN 24 (*)    Total Bilirubin 1.3 (*)    All other components within normal limits  URINALYSIS, ROUTINE W REFLEX MICROSCOPIC - Abnormal; Notable for the following components:   Color, Urine AMBER (*)    APPearance HAZY (*)  Specific Gravity, Urine 1.033 (*)    Ketones, ur 5 (*)    All other components within normal limits  MAGNESIUM  TSH  LIPASE, BLOOD  POC URINE PREG, ED     EKG  ED ECG REPORT I, Blake Divine, the attending physician, personally viewed and interpreted this ECG.   Date: 06/22/2021  EKG Time: 12:55  Rate: 86  Rhythm: normal sinus rhythm  Axis: Normal  Intervals:none  ST&T Change: None  RADIOLOGY CT head reviewed and interpreted by me with no hemorrhage or midline shift.  PROCEDURES:  Critical Care performed: No  Procedures   MEDICATIONS ORDERED IN ED: Medications  sodium chloride 0.9 % bolus 1,000 mL (1,000 mLs Intravenous New Bag/Given 06/22/21 1332)  droperidol (INAPSINE) 2.5 MG/ML injection 2.5 mg (2.5 mg Intravenous Given 06/22/21 1344)      IMPRESSION / MDM / ASSESSMENT AND PLAN / ED COURSE  I reviewed the triage vital signs and the nursing notes.                              32 y.o. female with PMHx of hypertension and alcohol abuse who presents to the ED complaining of 3 days of upper abdominal discomfort with nausea and poor p.o. intake, no complaints of shaking with tingling in both arms and slurred speech.  Differential diagnosis includes, but is not limited to, stroke, TIA, electrolyte abnormality, seizure, arrhythmia, dehydration, pancreatitis, hepatitis, biliary colic, gastritis, UTI, chronic abdominal pain.  Patient nontoxic-appearing and in no acute distress, vital signs are unremarkable and she has no focal neurologic deficits on exam.  She does have stuttering speech but no word finding difficulties or receptive aphasia to suggest stroke.  Numbness and tingling is bilateral in her upper extremities with no focal weakness noted.  We will check CT head but low suspicion for stroke or TIA.  I would be more concerned for dehydration and electrolyte abnormality given her abdominal pain with nausea and poor p.o. intake over the last couple of days.  She also appears extremely anxious, which may be a contributing factor.  EKG shows no evidence of arrhythmia or ischemia.  Pregnancy testing is negative and urinalysis shows no signs of infection.  Labs are reassuring with mild leukocytosis, no anemia noted.  BMP appears consistent with mild dehydration given elevated BUN to creatinine ratio, no electrolyte abnormality noted and LFTs and lipase are within normal limits.  CT head is negative for acute process, patient's abdominal pain is improved following dose of IV droperidol along with IV fluid bolus.  She is appropriate for discharge home with neurology follow-up for paresthesia as well as GI follow-up for acute on chronic abdominal pain.  She was counseled to return to the ED for new worsening symptoms, patient agrees with  plan.      FINAL CLINICAL IMPRESSION(S) / ED DIAGNOSES   Final diagnoses:  Paresthesia  Chronic abdominal pain     Rx / DC Orders   ED Discharge Orders     None        Note:  This document was prepared using Dragon voice recognition software and may include unintentional dictation errors.   Blake Divine, MD 06/22/21 [phone]

## 2021-06-22 NOTE — ED Provider Triage Note (Signed)
Emergency Medicine Provider Triage Evaluation Note  Gina Price , a 31 y.o. female  was evaluated in triage.  Pt complains of abdominal pain with poor p.o. intake for the past 3 days along with intermittent tingling in her arms and slurred speech, acutely worse this morning about 1 hour prior to arrival.  Review of Systems  Positive: Abdominal pain, nausea, slurred speech, numbness and tingling. Negative: Weakness, blurred vision, vomiting, diarrhea, dysuria.  Physical Exam  BP (!) 157/102 (BP Location: Right Arm)   Pulse (!) 135   Temp 98.6 F (37 C) (Oral)   SpO2 95%  Gen:   Awake, no distress Resp:  Normal effort, clear to auscultation bilaterally. MSK:   Moves extremities without difficulty Other:  No facial droop noted, cranial nerves II through XII grossly intact, no focal weakness noted in all 4 extremities.  Intermittent slurred speech noted.  Medical Decision Making  Medically screening exam initiated at 11:03 AM.  Appropriate orders placed.  Gina Price was informed that the remainder of the evaluation will be completed by another provider, this initial triage assessment does not replace that evaluation, and the importance of remaining in the ED until their evaluation is complete.  31 year old female presents with 3 days of abdominal pain and nausea which is acute on chronic, has had poor p.o. intake during this time.  She complains of intermittent tingling in her bilateral arms with slurred speech, acutely worse this morning.  Low suspicion for acute stroke given her bilateral symptoms that have been fluctuating for multiple days, would favor electrolyte abnormality.  No indication for code stroke at this time, orders for initial work-up were placed.   Gina Noon, MD 06/22/21 1106

## 2021-06-22 NOTE — ED Notes (Signed)
Taken to CT.

## 2021-07-11 ENCOUNTER — Ambulatory Visit: Payer: Medicaid Other | Admitting: Gastroenterology

## 2021-07-11 ENCOUNTER — Encounter: Payer: Self-pay | Admitting: Gastroenterology

## 2021-07-11 NOTE — Progress Notes (Deleted)
Wyline Mood MD, MRCP(U.K) [address]  [address]  [place], [place] [address]  Main: [phone]  Fax: [phone]   Primary Care Physician: Pcp, No  Primary Gastroenterologist:  Dr. Wyline Mood   No chief complaint on file.   HPI: Gina Price is a 31 y.o. female\  Summary of history :   She is here today to see me for hospital follow-up.  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  Did not require steroids as she did not meet the criteria.  No evidence of portal vein thrombosis on liver Doppler.   03/19/2021 ultrasound of the liver Doppler showed no portal vein thrombosis.  Right upper quadrant ultrasound showed CBD 6 mm hepatic steatosis.   She underwent complete autoimmune and viral hepatitis screen showed only an elevated ferritin of 2900 with iron percentage saturation of 98% otherwise EBV CMV HSV VZV hepatitis B and C serology were negative.  HIV negative ceruloplasmin was low.  AST and ALT were greater than 300 with a total bilirubin of 2.1 and INR of 1.5  03/29/2021 LFTs have almost normalized significant improvement albumin 3.8.   Interval history 05/16/2021-07/11/2021  05/18/2021: EGD: no esophageal varices 05/25/2021:  Ct abdomen : distened stomach with debris , large qty of stool in colon : commenced on linzess.  06/22/2021: LFT's normal    She states that since last visit she has been having abdominal pain.  Lower abdominal on and off not precipitated by eating denies any NSAID use having daily bowel movements on Linzess denies any constipation.  Has stopped all alcohol consumption.   Current Outpatient Medications  Medication Sig Dispense Refill   ADDERALL XR 25 MG 24 hr capsule Take 1 capsule by mouth every morning.     amphetamine-dextroamphetamine (ADDERALL) 20 MG tablet Take 25 mg by mouth daily.     atorvastatin (LIPITOR) 10 MG tablet Take 10 mg by mouth daily.     busPIRone  (BUSPAR) 7.5 MG tablet Take 7.5 mg by mouth 3 (three) times daily.     dicyclomine (BENTYL) 20 MG tablet Take 1 tablet (20 mg total) by mouth 4 (four) times daily -  before meals and at bedtime for 5 days. 20 tablet 0   gabapentin (NEURONTIN) 600 MG tablet Take 600 mg by mouth 3 (three) times daily.     linaclotide (LINZESS) 145 MCG CAPS capsule Take 1 capsule (145 mcg total) by mouth daily before breakfast. 90 capsule 3   lisinopril (ZESTRIL) 5 MG tablet Take 5 mg by mouth every morning.     lithium 600 MG capsule Take 600 mg by mouth at bedtime. (Patient not taking: Reported on 05/18/2021)     naproxen (NAPROSYN) 500 MG tablet Take 1 tablet (500 mg total) by mouth 2 (two) times daily with a meal. 20 tablet 0   omeprazole (PRILOSEC) 40 MG capsule Take 1 capsule (40 mg total) by mouth daily. 90 capsule 3   ondansetron (ZOFRAN-ODT) 4 MG disintegrating tablet Take 1 tablet (4 mg total) by mouth every 8 (eight) hours as needed for nausea or vomiting. 20 tablet 0   promethazine (PHENERGAN) 12.5 MG tablet Take 1 tablet (12.5 mg total) by mouth every 6 (six) hours as needed for nausea or vomiting. 16 tablet 0   propranolol (INDERAL) 10 MG tablet Take 10 mg by mouth 2 (two) times daily.     No current facility-administered medications for this visit.  Allergies as of 07/11/2021 - Review Complete 06/22/2021  Allergen Reaction Noted   Albuterol Shortness Of Breath and Anaphylaxis 04/14/2020   Penicillins Anaphylaxis 08/26/2015    ROS:  General: Negative for anorexia, weight loss, fever, chills, fatigue, weakness. ENT: Negative for hoarseness, difficulty swallowing , nasal congestion. CV: Negative for chest pain, angina, palpitations, dyspnea on exertion, peripheral edema.  Respiratory: Negative for dyspnea at rest, dyspnea on exertion, cough, sputum, wheezing.  GI: See history of present illness. GU:  Negative for dysuria, hematuria, urinary incontinence, urinary frequency, nocturnal urination.   Endo: Negative for unusual weight change.    Physical Examination:   There were no vitals taken for this visit.  General: Well-nourished, well-developed in no acute distress.  Eyes: No icterus. Conjunctivae pink. Mouth: Oropharyngeal mucosa moist and pink , no lesions erythema or exudate. Lungs: Clear to auscultation bilaterally. Non-labored. Heart: Regular rate and rhythm, no murmurs rubs or gallops.  Abdomen: Bowel sounds are normal, nontender, nondistended, no hepatosplenomegaly or masses, no abdominal bruits or hernia , no rebound or guarding.   Extremities: No lower extremity edema. No clubbing or deformities. Neuro: Alert and oriented x 3.  Grossly intact. Skin: Warm and dry, no jaundice.   Psych: Alert and cooperative, normal mood and affect.   Imaging Studies: DG Chest 2 View  Result Date: 06/22/2021 CLINICAL DATA:  Shortness of breath EXAM: CHEST - 2 VIEW COMPARISON:  Chest x-ray 10/10/2020 FINDINGS: Heart size and mediastinal contours are within normal limits. No suspicious pulmonary opacities identified. No pleural effusion or pneumothorax visualized. No acute osseous abnormality appreciated. IMPRESSION: No acute intrathoracic process identified. Electronically Signed   By: Jannifer Hick M.D.   On: 06/22/2021 13:48   CT Head Wo Contrast  Result Date: 06/22/2021 CLINICAL DATA:  Neuro deficit, acute, stroke suspected. Slurred speech and twitching. EXAM: CT HEAD WITHOUT CONTRAST TECHNIQUE: Contiguous axial images were obtained from the base of the skull through the vertex without intravenous contrast. RADIATION DOSE REDUCTION: This exam was performed according to the departmental dose-optimization program which includes automated exposure control, adjustment of the mA and/or kV according to patient size and/or use of iterative reconstruction technique. COMPARISON:  05/01/2019 FINDINGS: Brain: The brain shows a normal appearance without evidence of malformation, atrophy, old or  acute small or large vessel infarction, mass lesion, hemorrhage, hydrocephalus or extra-axial collection. Vascular: No hyperdense vessel. No evidence of atherosclerotic calcification. Skull: Normal.  No traumatic finding.  No focal bone lesion. Sinuses/Orbits: Sinuses are clear. Orbits appear normal. Mastoids are clear. Other: None significant IMPRESSION: Normal head CT. Electronically Signed   By: Paulina Fusi M.D.   On: 06/22/2021 13:15    Assessment and Plan:   Clora Ohmer is a 31 y.o. 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  Since hospitalization she has stopped all alcohol consumption and LFTs are normalized.  On admission she had acute liver failure that has also resolved.  Presently her only issue is lower abdominal pain.  Initially I thought it was secondary to constipation but despite treating with Linzess and having adequate good bowel movements continues to have the pain.   Plan 1.  Continue alcohol cessation 2.  Continue Prilosec 3.  EGD  in 05/2024 to screen for esophageal varices.  4.  lactulose    Dr Wyline Mood  MD,MRCP [facility]) Follow up in ***

## 2021-07-15 ENCOUNTER — Ambulatory Visit (INDEPENDENT_AMBULATORY_CARE_PROVIDER_SITE_OTHER): Payer: Medicaid Other | Admitting: Gastroenterology

## 2021-07-15 ENCOUNTER — Encounter: Payer: Self-pay | Admitting: Gastroenterology

## 2021-07-15 VITALS — BP 146/101 | HR 92 | Temp 98.1°F | Wt 140.0 lb

## 2021-07-15 DIAGNOSIS — Z8719 Personal history of other diseases of the digestive system: Secondary | ICD-10-CM

## 2021-07-15 DIAGNOSIS — R52 Pain, unspecified: Secondary | ICD-10-CM | POA: Diagnosis not present

## 2021-07-15 NOTE — Progress Notes (Unsigned)
Wyline Mood MD, MRCP(U.K) [address]  [address]  [place], [place] [address]  Main: [phone]  Fax: [phone]   Primary Care Physician: Pcp, No  Primary Gastroenterologist:  Dr. Wyline Mood   Chief Complaint  Patient presents with   Abdominal Pain    HPI: Gina Price is a 31 y.o. female  Summary of history :   She is here today to see me for hospital follow-up.  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  Did not require steroids as she did not meet the criteria.  No evidence of portal vein thrombosis on liver Doppler.   03/19/2021 ultrasound of the liver Doppler showed no portal vein thrombosis.  Right upper quadrant ultrasound showed CBD 6 mm hepatic steatosis.   She underwent complete autoimmune and viral hepatitis screen showed only an elevated ferritin of 2900 with iron percentage saturation of 98% otherwise EBV CMV HSV VZV hepatitis B and C serology were negative.  HIV negative ceruloplasmin was low.  AST and ALT were greater than 300 with a total bilirubin of 2.1 and INR of 1.5  03/29/2021 LFTs have almost normalized significant improvement albumin 3.8.   Interval history  05/16/2021-07/15/2021  05/25/2021: Large qty stool in colon , large amount of food in the stomach.  06/22/2021: CBC and CMP- normal   The reason she is here today to see me for generalized body aches and pains.  Nonspecific all her joints arms and legs have been hurting for a while this has been going on for quite a few months.  Recently she started drinking alcohol yet again.  Has not yet sought any help to stop drinking alcohol.  Denies any specific abdominal pain taking lactulose.       Current Outpatient Medications  Medication Sig Dispense Refill   ADDERALL XR 25 MG 24 hr capsule Take 1 capsule by mouth every morning.     amphetamine-dextroamphetamine (ADDERALL) 20 MG tablet Take 25 mg by mouth daily.      atorvastatin (LIPITOR) 10 MG tablet Take 10 mg by mouth daily.     busPIRone (BUSPAR) 7.5 MG tablet Take 7.5 mg by mouth 3 (three) times daily.     dicyclomine (BENTYL) 20 MG tablet Take 1 tablet (20 mg total) by mouth 4 (four) times daily -  before meals and at bedtime for 5 days. 20 tablet 0   gabapentin (NEURONTIN) 600 MG tablet Take 600 mg by mouth 3 (three) times daily.     linaclotide (LINZESS) 145 MCG CAPS capsule Take 1 capsule (145 mcg total) by mouth daily before breakfast. 90 capsule 3   lisinopril (ZESTRIL) 5 MG tablet Take 5 mg by mouth every morning.     lithium 600 MG capsule Take 600 mg by mouth at bedtime.     naproxen (NAPROSYN) 500 MG tablet Take 1 tablet (500 mg total) by mouth 2 (two) times daily with a meal. 20 tablet 0   omeprazole (PRILOSEC) 40 MG capsule Take 1 capsule (40 mg total) by mouth daily. 90 capsule 3   ondansetron (ZOFRAN-ODT) 4 MG disintegrating tablet Take 1 tablet (4 mg total) by mouth every 8 (eight) hours as needed for nausea or vomiting. 20 tablet 0   promethazine (PHENERGAN) 12.5 MG tablet Take 1 tablet (12.5 mg total) by mouth every 6 (six) hours as needed for nausea or vomiting. 16 tablet 0   propranolol (INDERAL) 10 MG tablet  Take 10 mg by mouth 2 (two) times daily.     No current facility-administered medications for this visit.    Allergies as of 07/15/2021 - Review Complete 07/15/2021  Allergen Reaction Noted   Albuterol Shortness Of Breath and Anaphylaxis 04/14/2020   Penicillins Anaphylaxis 08/26/2015    ROS:  General: Negative for anorexia, weight loss, fever, chills, fatigue, weakness. ENT: Negative for hoarseness, difficulty swallowing , nasal congestion. CV: Negative for chest pain, angina, palpitations, dyspnea on exertion, peripheral edema.  Respiratory: Negative for dyspnea at rest, dyspnea on exertion, cough, sputum, wheezing.  GI: See history of present illness. GU:  Negative for dysuria, hematuria, urinary incontinence, urinary  frequency, nocturnal urination.  Endo: Negative for unusual weight change.    Physical Examination:   BP (!) 146/101   Pulse 92   Temp 98.1 F (36.7 C) (Oral)   Wt 140 lb (63.5 kg)   BMI 27.34 kg/m   General: Well-nourished, well-developed in no acute distress.  Eyes: No icterus. Conjunctivae pink. Extremities: No lower extremity edema. No clubbing or deformities. Neuro: Alert and oriented x 3.  Grossly intact. Skin: Warm and dry, no jaundice.   Psych: Alert and cooperative, normal mood and affect.   Imaging Studies: DG Chest 2 View  Result Date: 06/22/2021 CLINICAL DATA:  Shortness of breath EXAM: CHEST - 2 VIEW COMPARISON:  Chest x-ray 10/10/2020 FINDINGS: Heart size and mediastinal contours are within normal limits. No suspicious pulmonary opacities identified. No pleural effusion or pneumothorax visualized. No acute osseous abnormality appreciated. IMPRESSION: No acute intrathoracic process identified. Electronically Signed   By: Jannifer Hick M.D.   On: 06/22/2021 13:48   CT Head Wo Contrast  Result Date: 06/22/2021 CLINICAL DATA:  Neuro deficit, acute, stroke suspected. Slurred speech and twitching. EXAM: CT HEAD WITHOUT CONTRAST TECHNIQUE: Contiguous axial images were obtained from the base of the skull through the vertex without intravenous contrast. RADIATION DOSE REDUCTION: This exam was performed according to the departmental dose-optimization program which includes automated exposure control, adjustment of the mA and/or kV according to patient size and/or use of iterative reconstruction technique. COMPARISON:  05/01/2019 FINDINGS: Brain: The brain shows a normal appearance without evidence of malformation, atrophy, old or acute small or large vessel infarction, mass lesion, hemorrhage, hydrocephalus or extra-axial collection. Vascular: No hyperdense vessel. No evidence of atherosclerotic calcification. Skull: Normal.  No traumatic finding.  No focal bone lesion.  Sinuses/Orbits: Sinuses are clear. Orbits appear normal. Mastoids are clear. Other: None significant IMPRESSION: Normal head CT. Electronically Signed   By: Paulina Fusi M.D.   On: 06/22/2021 13:15    Assessment and Plan:   Gina Price is a 31 y.o. 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  Since hospitalization she has stopped all alcohol consumption and LFTs are normalized.  On admission she had acute liver failure that has also resolved.  She had stopped drinking all alcohol completely and has since restarted.  I explained to her that the biggest issue that she has at this point of time is to stop the alcohol dependency.  Explained to is important for her to seek help such as trying alcoholic Anonymous or other support groups.  Information about the same will be given.  I explained to her that her aches and pains that she has all over her body are unlikely to improve with alcohol intake.  I explained to her that if she has another episode of  severe alcohol hepatitis like she had recently it could actually kill her.  Advised to titrate lactulose as needed for adequate bowel movements I will check CBC CMP INR and vitamin D levels. She has not disclosed this issue with anyone in her family I explained to her that it is probably good to have some support from family members I offered to speak to her and anybody in her family to help her get through this.  I also suggest that she may need help from psychiatry to treat any underlying condition..  Advised her to discuss with her PCP.  Dr Wyline Mood  MD,MRCP [facility]) Follow up in as needed

## 2021-07-15 NOTE — Patient Instructions (Signed)
For your pain, please only take Tylenol as needed.  Please take half of the dosage of your Lactulose.  Please stop drinking.  Please contact the following: Alcoholics Anonymous [phone]  Primary care doctor to discuss your body aches and pain. Let them know that you will be needing a referral to see a psychiatrist.  Please go and have your labs drawn today at [facility] pharmacy off of [address] and [place].

## 2021-07-25 LAB — PROTIME-INR
INR: 1.1 (ref 0.9–1.2)
Prothrombin Time: 11.4 s (ref 9.1–12.0)

## 2021-07-25 LAB — COMPREHENSIVE METABOLIC PANEL
ALT: 28 IU/L (ref 0–32)
AST: 30 IU/L (ref 0–40)
Albumin/Globulin Ratio: 1.8 (ref 1.2–2.2)
Albumin: 4.6 g/dL (ref 3.8–4.8)
Alkaline Phosphatase: 80 IU/L (ref 44–121)
BUN/Creatinine Ratio: 21 (ref 9–23)
BUN: 16 mg/dL (ref 6–20)
Bilirubin Total: 0.3 mg/dL (ref 0.0–1.2)
CO2: 20 mmol/L (ref 20–29)
Calcium: 9.2 mg/dL (ref 8.7–10.2)
Chloride: 99 mmol/L (ref 96–106)
Creatinine, Ser: 0.77 mg/dL (ref 0.57–1.00)
Globulin, Total: 2.6 g/dL (ref 1.5–4.5)
Glucose: 108 mg/dL — ABNORMAL HIGH (ref 70–99)
Potassium: 4.3 mmol/L (ref 3.5–5.2)
Sodium: 134 mmol/L (ref 134–144)
Total Protein: 7.2 g/dL (ref 6.0–8.5)
eGFR: 105 mL/min/{1.73_m2} (ref >59)

## 2021-07-25 LAB — VITAMIN D 1,25 DIHYDROXY
Vitamin D 1, 25 (OH)2 Total: 24 pg/mL
Vitamin D2 1, 25 (OH)2: <10 pg/mL
Vitamin D3 1, 25 (OH)2: 24 pg/mL

## 2021-07-25 LAB — CBC
Hematocrit: 45.6 % (ref 34.0–46.6)
Hemoglobin: 15.2 g/dL (ref 11.1–15.9)
MCH: 29.2 pg (ref 26.6–33.0)
MCHC: 33.3 g/dL (ref 31.5–35.7)
MCV: 88 fL (ref 79–97)
Platelets: 385 10*3/uL (ref 150–450)
RBC: 5.2 x10E6/uL (ref 3.77–5.28)
RDW: 13.6 % (ref 11.7–15.4)
WBC: 6.7 10*3/uL (ref 3.4–10.8)

## 2021-12-20 ENCOUNTER — Encounter: Payer: Self-pay | Admitting: Emergency Medicine

## 2021-12-20 ENCOUNTER — Emergency Department
Admission: EM | Admit: 2021-12-20 | Discharge: 2021-12-20 | Disposition: A | Discharge status: Home / Self Care | Payer: Medicaid Other | Attending: Emergency Medicine | Admitting: Emergency Medicine

## 2021-12-20 ENCOUNTER — Other Ambulatory Visit: Payer: Self-pay

## 2021-12-20 DIAGNOSIS — R21 Rash and other nonspecific skin eruption: Secondary | ICD-10-CM | POA: Diagnosis present

## 2021-12-20 DIAGNOSIS — Z20822 Contact with and (suspected) exposure to covid-19: Secondary | ICD-10-CM | POA: Diagnosis not present

## 2021-12-20 DIAGNOSIS — M791 Myalgia, unspecified site: Secondary | ICD-10-CM | POA: Diagnosis not present

## 2021-12-20 DIAGNOSIS — R52 Pain, unspecified: Secondary | ICD-10-CM

## 2021-12-20 LAB — RESP PANEL BY RT-PCR (FLU A&B, COVID) ARPGX2
Influenza A by PCR: NEGATIVE
Influenza B by PCR: NEGATIVE
SARS Coronavirus 2 by RT PCR: NEGATIVE

## 2021-12-20 LAB — SEDIMENTATION RATE: Sed Rate: 12 mm/hr (ref 0–20)

## 2021-12-20 LAB — C-REACTIVE PROTEIN: CRP: 1.6 mg/dL — ABNORMAL HIGH (ref <1.0)

## 2021-12-20 MED ORDER — PREDNISONE 10 MG (21) PO TBPK: ORAL_TABLET | ORAL | 0 refills | Status: AC

## 2021-12-20 NOTE — ED Triage Notes (Signed)
Patient arrives ambulatory by POV c/o rash to face, neck and chest onset of yesterday. Patient states Sunday night having generalized body aches and joint pain. Patient adds that her right hip feels like it is popping out of place when walking. Patient concerned for RA.

## 2021-12-20 NOTE — ED Provider Notes (Signed)
[facility] Provider Note   Event Date/Time   First MD Initiated Contact with Patient 12/20/21 [phone]     (approximate) History  Rash  HPI Gina Price is a 31 y.o. female with no stated past medical history but significant family medical history of rheumatoid arthritis and lupus who presents for a red rash to bilateral face and neck as well as generalized joint pains that began approximately 2 days prior to arrival and have been worsening since onset.  Patient states that she has had similar symptoms intermittently over the past 2 years and is concerned that she may have an autoimmune disease however has not followed up with a primary care physician or rheumatologist regarding the symptoms. ROS: Patient currently denies any vision changes, tinnitus, difficulty speaking, facial droop, sore throat, chest pain, shortness of breath, abdominal pain, nausea/vomiting/diarrhea, dysuria, or weakness/numbness/paresthesias in any extremity   Physical Exam  Triage Vital Signs: ED Triage Vitals  Enc Vitals Group     BP 12/20/21 0906 (!) 138/99     Pulse Rate 12/20/21 0906 97     Resp 12/20/21 0906 18     Temp 12/20/21 0906 98.4 F (36.9 C)     Temp Source 12/20/21 0906 Oral     SpO2 12/20/21 0906 100 %     Weight 12/20/21 0905 140 lb (63.5 kg)     Height 12/20/21 0905 5' (1.524 m)     Head Circumference --      Peak Flow --      Pain Score 12/20/21 0905 5     Pain Loc --      Pain Edu? --      Excl. in [place]? --    Most recent vital signs: Vitals:   12/20/21 0906  BP: (!) 138/99  Pulse: 97  Resp: 18  Temp: 98.4 F (36.9 C)  SpO2: 100%   General: Awake, oriented x4. CV:  Good peripheral perfusion.  Resp:  Normal effort.  Abd:  No distention.  Other:  Middle-aged overweight Caucasian female laying in bed in no acute distress.  Fixed erythematous flat rash over the malar eminences sparing the nasolabial fold ED Results / Procedures / Treatments    PROCEDURES: Critical Care performed: No Procedures MEDICATIONS ORDERED IN ED: Medications - No data to display IMPRESSION / MDM / [place] / ED COURSE  I reviewed the triage vital signs and the nursing notes.              The patient is on the cardiac monitor to evaluate for evidence of arrhythmia and/or significant heart rate changes. Patient's presentation is most consistent with  Patient is a 31 year old female with significant family history of autoimmune disease who presents for classic malar rash and generalized joint pains concerning for possible autoimmune disease.  As patient has had generalized weakness and flushing, will rule out possible viral pathology with COVID/flu swab.  We will also empirically test for ESR, CRP, and ANA.  Patient shows no signs of red flag symptomatology at this time and is stable for discharge home as well as follow-up with primary care physician in the next 1-3 days for follow-up on this testing as well as resolution of symptoms given prescription for steroids.  At this time I have low suspicion for HIV, meningitis, or septic joint.  Dispo: Discharge home with PCP follow-up   FINAL CLINICAL IMPRESSION(S) / ED DIAGNOSES   Final diagnoses:  Malar rash  Body aches   Rx /  DC Orders   ED Discharge Orders          Ordered    predniSONE (STERAPRED UNI-PAK 21 TAB) 10 MG (21) TBPK tablet        12/20/21 4680           Note:  This document was prepared using Dragon voice recognition software and may include unintentional dictation errors.   Naaman Plummer, MD 12/20/21 [phone]

## 2021-12-21 LAB — ANA W/REFLEX: Anti Nuclear Antibody (ANA): NEGATIVE

## 2022-08-27 ENCOUNTER — Ambulatory Visit
Admission: RE | Admit: 2022-08-27 | Discharge: 2022-08-27 | Disposition: A | Discharge status: Home / Self Care | Payer: MEDICAID | Source: Ambulatory Visit | Attending: Emergency Medicine | Admitting: Emergency Medicine

## 2022-08-27 VITALS — BP 101/72 | HR 92 | Temp 99.0°F | Resp 18

## 2022-08-27 DIAGNOSIS — B349 Viral infection, unspecified: Secondary | ICD-10-CM | POA: Diagnosis present

## 2022-08-27 DIAGNOSIS — U071 COVID-19: Secondary | ICD-10-CM | POA: Diagnosis not present

## 2022-08-27 LAB — POCT RAPID STREP A (OFFICE): Rapid Strep A Screen: NEGATIVE

## 2022-08-27 NOTE — Discharge Instructions (Signed)
The strep test is negative.  Your COVID test is pending.    Take Tylenol as needed for fever or discomfort.  Rest and keep yourself hydrated.    Follow-up with your primary care provider if your symptoms are not improving.

## 2022-08-27 NOTE — ED Triage Notes (Signed)
Patient to Urgent Care with complaints of nasal congestion/ body aches/ cough that started three days ago.  Denies any known fevers. Has been taking otc cough medications. Works with children.
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
## 2022-12-25 ENCOUNTER — Encounter: Payer: Self-pay | Admitting: Obstetrics

## 2022-12-25 ENCOUNTER — Ambulatory Visit (INDEPENDENT_AMBULATORY_CARE_PROVIDER_SITE_OTHER): Payer: MEDICAID | Admitting: Obstetrics

## 2022-12-25 ENCOUNTER — Other Ambulatory Visit (HOSPITAL_COMMUNITY)
Admission: RE | Admit: 2022-12-25 | Discharge: 2022-12-25 | Disposition: A | Discharge status: Home / Self Care | Payer: MEDICAID | Source: Ambulatory Visit | Attending: Obstetrics | Admitting: Obstetrics

## 2022-12-25 VITALS — BP 110/70 | HR 99 | Ht 60.0 in | Wt 150.0 lb

## 2022-12-25 DIAGNOSIS — Z113 Encounter for screening for infections with a predominantly sexual mode of transmission: Secondary | ICD-10-CM | POA: Diagnosis present

## 2022-12-25 DIAGNOSIS — Z3202 Encounter for pregnancy test, result negative: Secondary | ICD-10-CM

## 2022-12-25 DIAGNOSIS — Z01419 Encounter for gynecological examination (general) (routine) without abnormal findings: Secondary | ICD-10-CM | POA: Diagnosis not present

## 2022-12-25 DIAGNOSIS — Z30011 Encounter for initial prescription of contraceptive pills: Secondary | ICD-10-CM

## 2022-12-25 DIAGNOSIS — Z1329 Encounter for screening for other suspected endocrine disorder: Secondary | ICD-10-CM

## 2022-12-25 DIAGNOSIS — Z131 Encounter for screening for diabetes mellitus: Secondary | ICD-10-CM

## 2022-12-25 DIAGNOSIS — Z13220 Encounter for screening for lipoid disorders: Secondary | ICD-10-CM

## 2022-12-25 LAB — POCT URINE PREGNANCY: Preg Test, Ur: NEGATIVE

## 2022-12-25 MED ORDER — NORETHIN ACE-ETH ESTRAD-FE 1-20 MG-MCG PO TABS: 1.0000 | ORAL_TABLET | Freq: Every day | ORAL | 3 refills | Status: DC
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
## 2022-12-26 ENCOUNTER — Telehealth: Payer: Self-pay

## 2022-12-26 LAB — HEP, RPR, HIV PANEL
HIV Screen 4th Generation wRfx: NONREACTIVE
Hepatitis B Surface Ag: NEGATIVE
RPR Ser Ql: NONREACTIVE

## 2022-12-26 LAB — CBC
Hematocrit: 39.1 % (ref 34.0–46.6)
Hemoglobin: 12.9 g/dL (ref 11.1–15.9)
MCH: 29 pg (ref 26.6–33.0)
MCHC: 33 g/dL (ref 31.5–35.7)
MCV: 88 fL (ref 79–97)
Platelets: 337 10*3/uL (ref 150–450)
RBC: 4.45 x10E6/uL (ref 3.77–5.28)
RDW: 12.2 % (ref 11.7–15.4)
WBC: 9.8 10*3/uL (ref 3.4–10.8)

## 2022-12-26 LAB — TSH+FREE T4
Free T4: 1.07 ng/dL (ref 0.82–1.77)
TSH: 1.66 u[IU]/mL (ref 0.450–4.500)

## 2022-12-26 LAB — LIPID PANEL
Chol/HDL Ratio: 3.8 {ratio} (ref 0.0–4.4)
Cholesterol, Total: 189 mg/dL (ref 100–199)
HDL: 50 mg/dL (ref >39)
LDL Chol Calc (NIH): 97 mg/dL (ref 0–99)
Triglycerides: 250 mg/dL — ABNORMAL HIGH (ref 0–149)
VLDL Cholesterol Cal: 42 mg/dL — ABNORMAL HIGH (ref 5–40)

## 2022-12-26 NOTE — Telephone Encounter (Signed)
I can the patient via phone, she declined coming this AM for work in due to work today The patient states she will try cranberry juice and is leaving for [place] on Thursday.

## 2022-12-26 NOTE — Telephone Encounter (Signed)
Pt was seen yesterday and forgot to mention she thinks she has a UTI; can something be sent in or does she need to be seen again?  Adv will probably need to come in for a nurse visit to leave urine specimen.  Will send msg to schedulers.

## 2022-12-27 ENCOUNTER — Other Ambulatory Visit: Payer: Self-pay

## 2022-12-27 DIAGNOSIS — B379 Candidiasis, unspecified: Secondary | ICD-10-CM

## 2022-12-27 LAB — CERVICOVAGINAL ANCILLARY ONLY
Bacterial Vaginitis (gardnerella): NEGATIVE
Candida Glabrata: NEGATIVE
Candida Vaginitis: POSITIVE — AB
Chlamydia: NEGATIVE
Comment: NEGATIVE
Comment: NEGATIVE
Comment: NEGATIVE
Comment: NEGATIVE
Comment: NEGATIVE
Comment: NORMAL
Neisseria Gonorrhea: NEGATIVE
Trichomonas: NEGATIVE

## 2022-12-27 MED ORDER — FLUCONAZOLE 150 MG PO TABS: 150.0000 mg | ORAL_TABLET | Freq: Once | ORAL | 0 refills | Status: AC

## 2023-01-05 ENCOUNTER — Ambulatory Visit (INDEPENDENT_AMBULATORY_CARE_PROVIDER_SITE_OTHER): Payer: MEDICAID

## 2023-01-05 VITALS — BP 122/77 | HR 92 | Ht 60.0 in | Wt 142.6 lb

## 2023-01-05 DIAGNOSIS — Z3202 Encounter for pregnancy test, result negative: Secondary | ICD-10-CM | POA: Diagnosis not present

## 2023-01-05 DIAGNOSIS — N912 Amenorrhea, unspecified: Secondary | ICD-10-CM

## 2023-01-05 DIAGNOSIS — Z3200 Encounter for pregnancy test, result unknown: Secondary | ICD-10-CM

## 2023-01-05 LAB — POCT URINE PREGNANCY: Preg Test, Ur: NEGATIVE

## 2023-01-05 NOTE — Progress Notes (Signed)
    NURSE VISIT NOTE  Subjective:    Patient ID: Gina Price, female    DOB: May 01, 1990, 32 y.o.   MRN: [id number]  HPI  Patient is a 32 y.o. [id number] female who presents for evaluation of amenorrhea. She believes she could be pregnant. Pregnancy is not desired. Sexual Activity: single partner, contraception: condoms. Current symptoms also include: positive home pregnancy test. Last period was normal. Pt has a faint positive home test. Pt unsure if she wants to keep pregnancy is blood work comes back positive. Pt states the condom broke during intercourse. Per her last period she would be [name] pregnant.    Objective:    BP 122/77   Pulse 92   Ht 5' (1.524 m)   Wt 142 lb 9.6 oz (64.7 kg)   LMP 12/09/2022   BMI 27.85 kg/m   Lab Review  Results for orders placed or performed in visit on 01/05/23  POCT urine pregnancy  Result Value Ref Range   Preg Test, Ur Negative Negative    Assessment:   1. Possible pregnancy, not confirmed     Plan:     Pregnancy Test: @PLANEND @Negative   Beta Quant Ordered per  Gina Price, CNM.   Gina Price, CMA

## 2023-01-06 LAB — BETA HCG QUANT (REF LAB): hCG Quant: <1 m[IU]/mL

## 2023-01-31 ENCOUNTER — Other Ambulatory Visit: Payer: Self-pay | Admitting: Medical Genetics

## 2023-02-21 ENCOUNTER — Other Ambulatory Visit
Admission: RE | Admit: 2023-02-21 | Discharge: 2023-02-21 | Disposition: A | Discharge status: Home / Self Care | Payer: Self-pay | Source: Ambulatory Visit | Attending: Medical Genetics | Admitting: Medical Genetics

## 2023-03-03 IMAGING — US US ABDOMEN LIMITED
2 series · 13 of 25 positions shown · non-contrast
Comparison: Right upper quadrant ultrasound 02/20/2021

CLINICAL DATA: Abdominal pain

EXAM:
ULTRASOUND ABDOMEN LIMITED RIGHT UPPER QUADRANT

[Series 1: us abdomen limited · 0.18mm/px · 12 of 39 slices shown (1 of 2)]
[im 1/39]
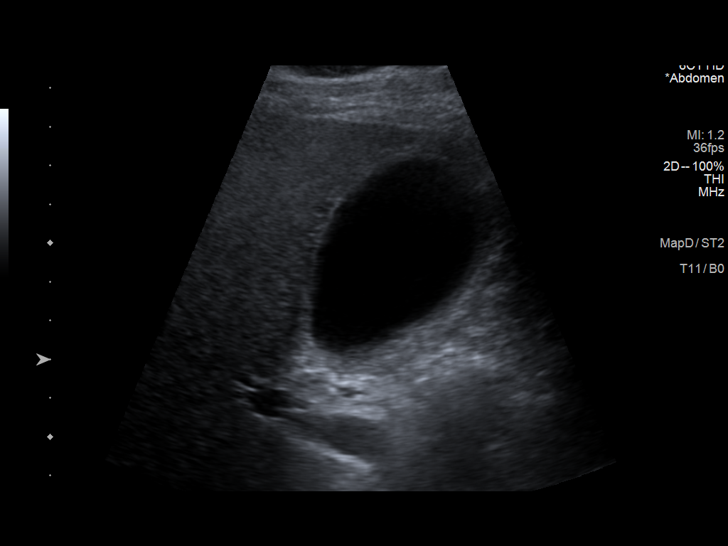
[im 4/39]
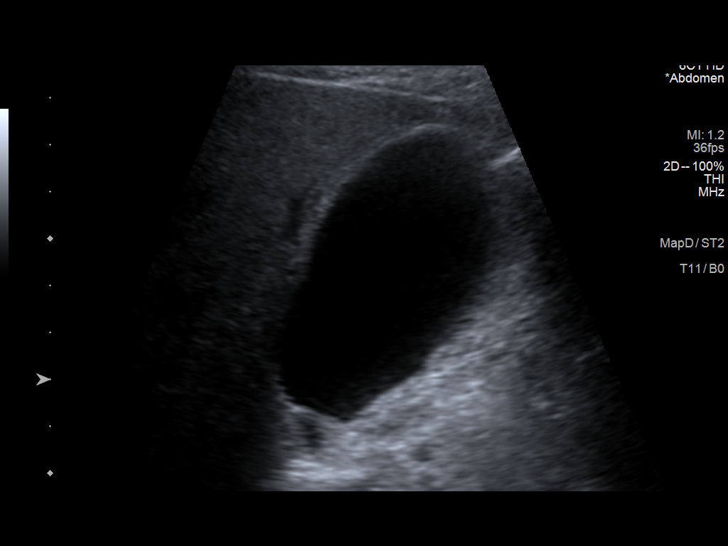
[im 7/39]
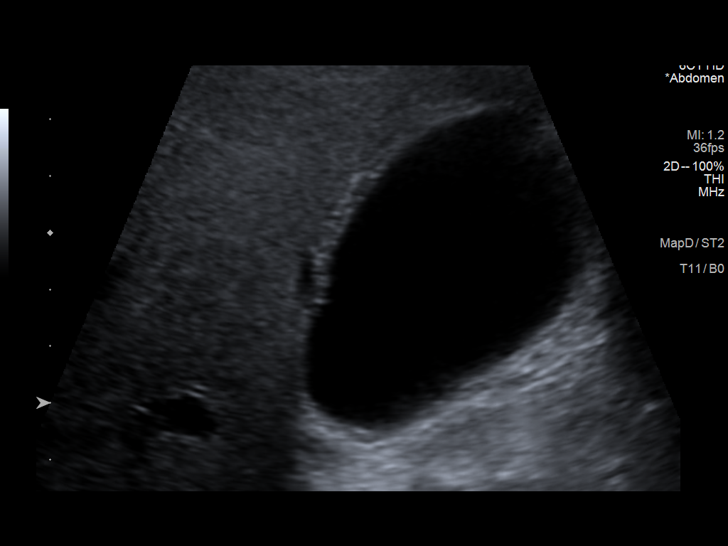
[im 10/39]
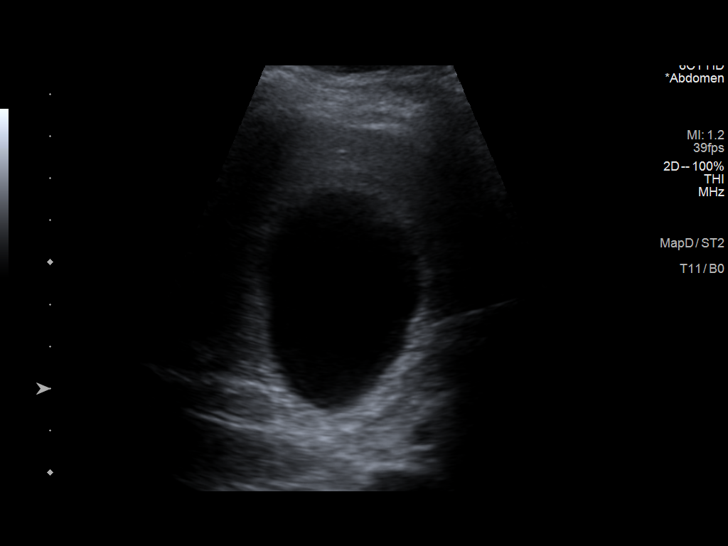
[im 14/39]
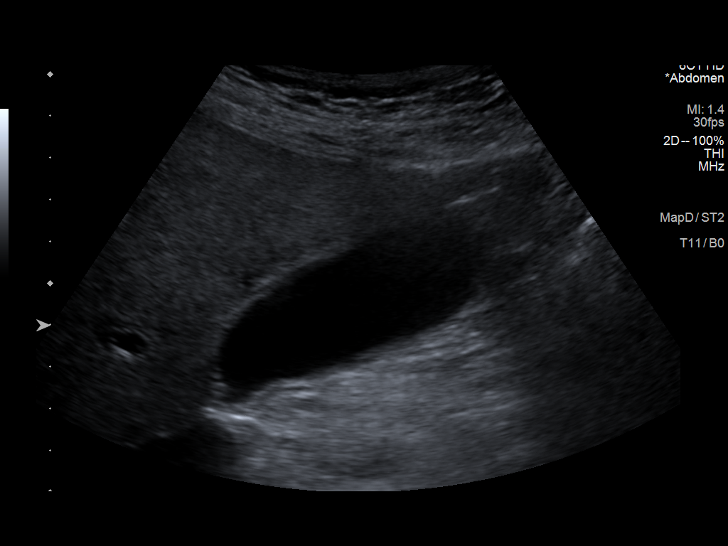
[im 17/39]
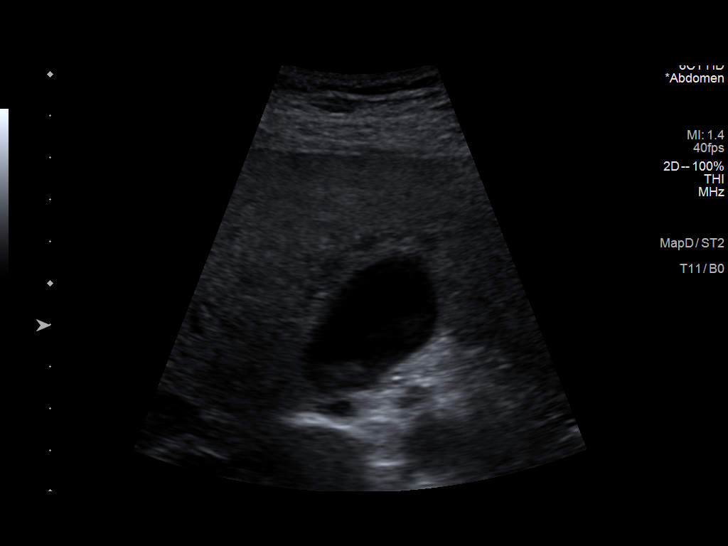
[im 20/39]
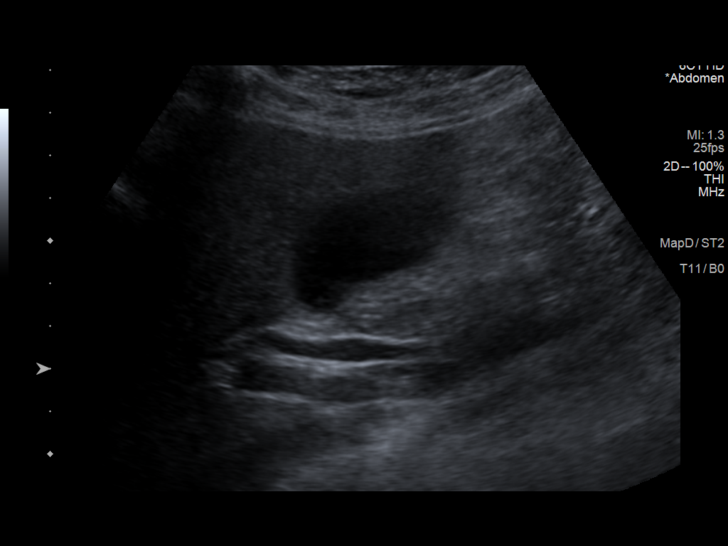
[im 24/39]
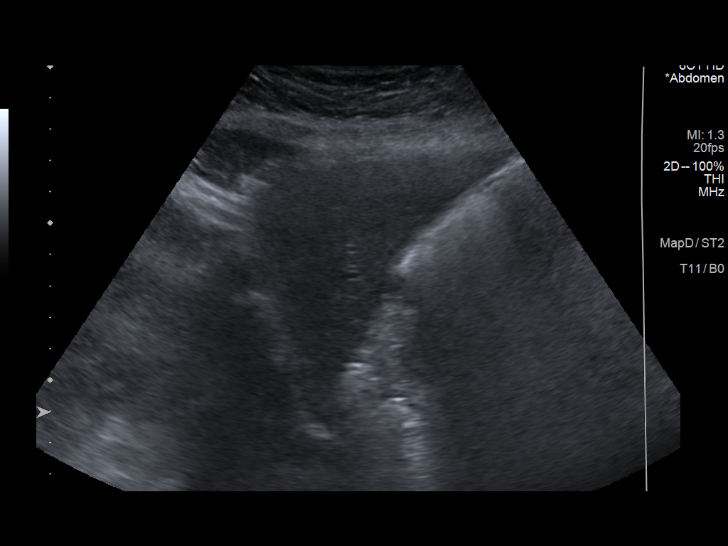
[im 27/39]
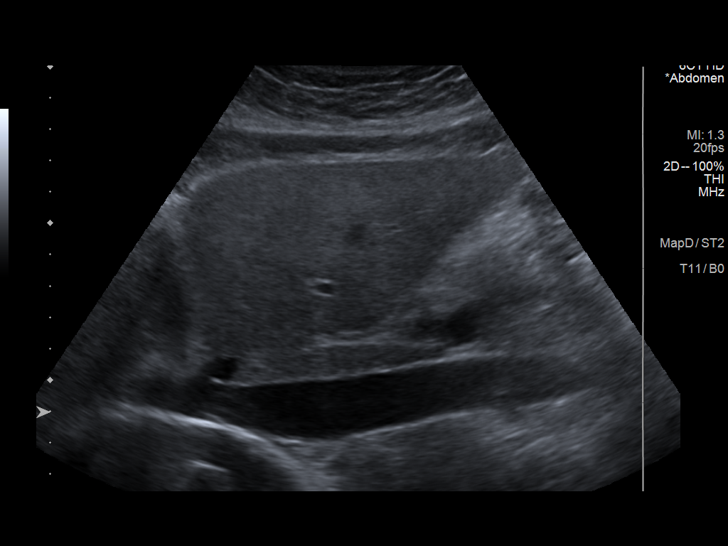
[im 30/39]
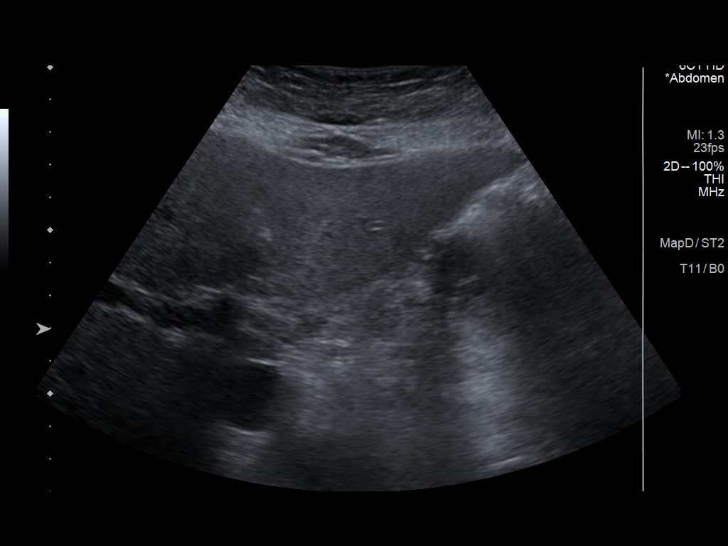
[im 34/39]
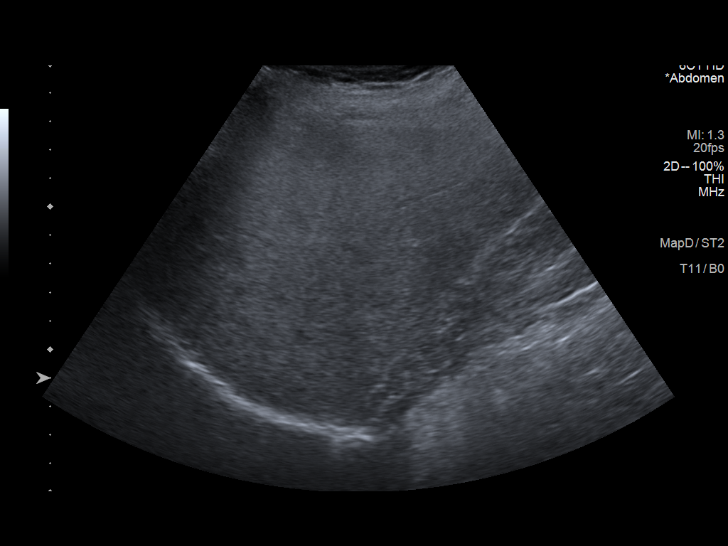
[im 37/39]
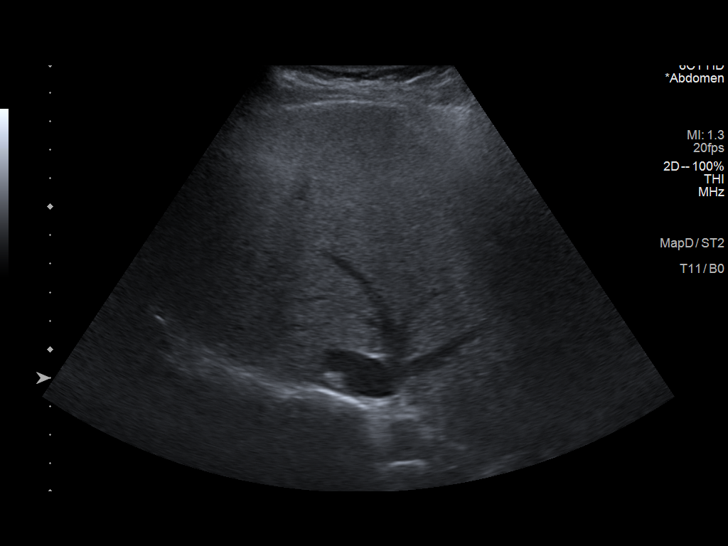

[Series 2001: us abdomen limited · 0.15mm/px · 1 of 1 slices shown (2 of 2)]
[im 1/1]
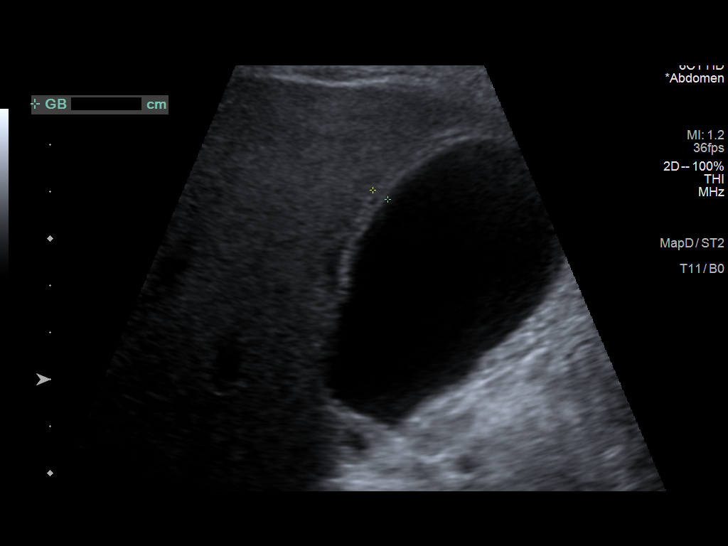

[13 of 25 positions shown; findings below may reference images not displayed]

FINDINGS: Gallbladder:

Distended, similar in appearance to prior exam. No intraluminal
gallstones or polyps. Upper normal gallbladder wall thickness of 3-4
mm. Trace pericholecystic fluid. No sonographic Murphy sign noted by
sonographer.

Common bile duct:

Diameter: 6 mm.  No visualized choledocholithiasis.

Liver:

Diffusely increased parenchymal echogenicity. There is focal fatty
sparing adjacent to the gallbladder fossa. No focal hepatic lesion.
No intrahepatic biliary ductal dilatation. No capsular nodularity.
Portal vein is patent on color Doppler imaging with normal direction
of blood flow towards the liver.

Other: None.
IMPRESSION: 1. Gallbladder distension, similar to prior exam. There is mild
gallbladder wall thickening and trace pericholecystic fluid. No
gallstones. This may represent acalculous cholecystitis in the
appropriate clinical setting.
2. Upper normal common bile duct at 6 mm.
3. Increased hepatic parenchymal echogenicity, typically seen with
steatosis.

## 2023-03-04 IMAGING — US US HEPATIC LIVER DOPPLER
1 series · 13 of 25 positions shown · non-contrast
Comparison: 03/18/2021

CLINICAL DATA: 30-year-old female with liver failure.

EXAM:
DUPLEX ULTRASOUND OF LIVER
TECHNIQUE: Color and duplex Doppler ultrasound was performed to evaluate the
hepatic in-flow and out-flow vessels.

[Series 1: us liver doppler · 13 of 61 slices shown]
[im 1/61]
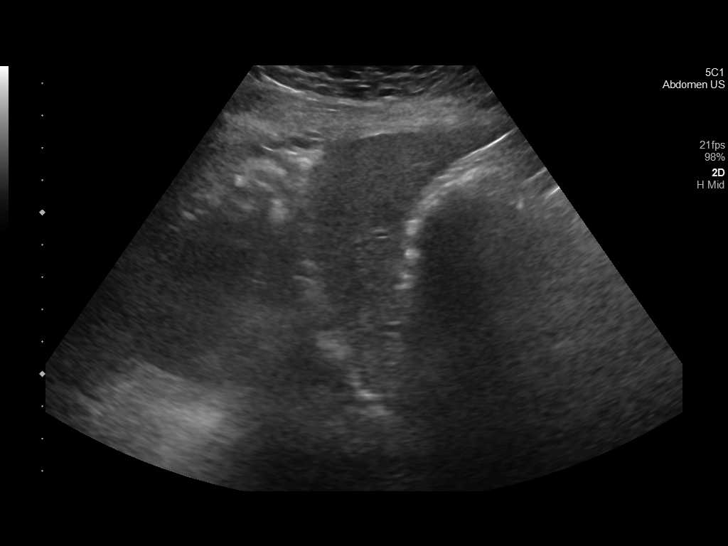
[im 6/61]
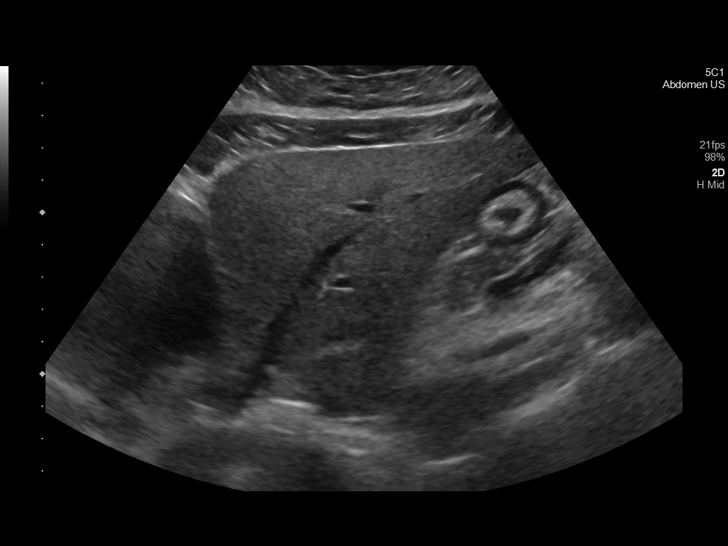
[im 11/61]
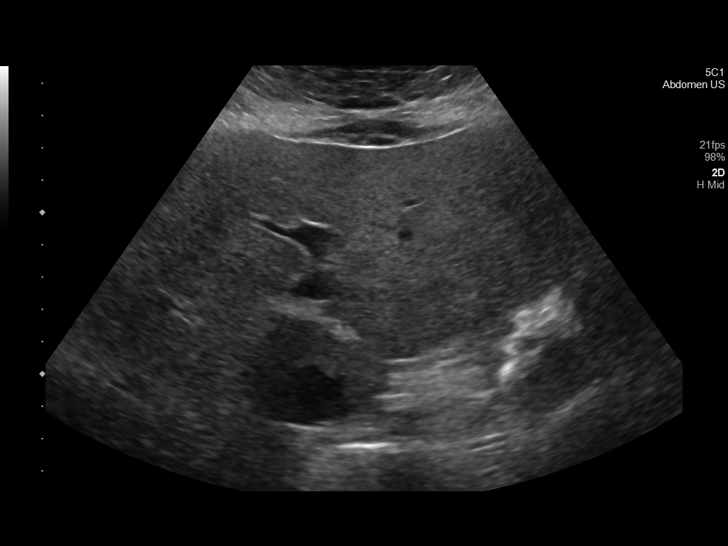
[im 16/61]
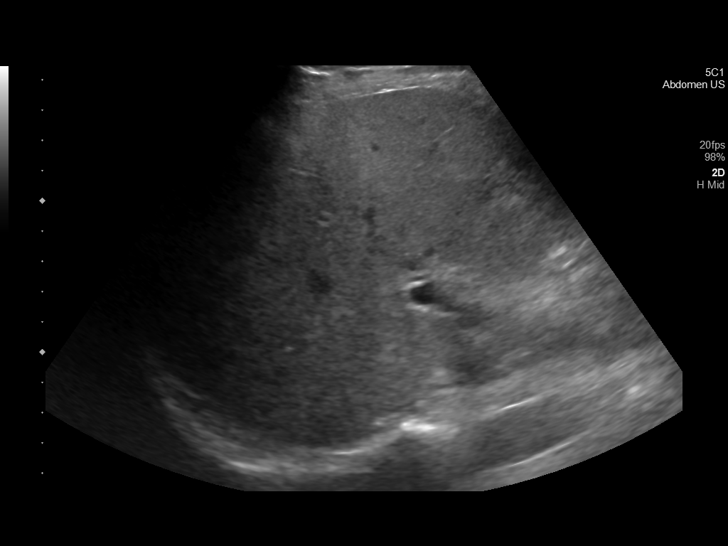
[im 21/61]
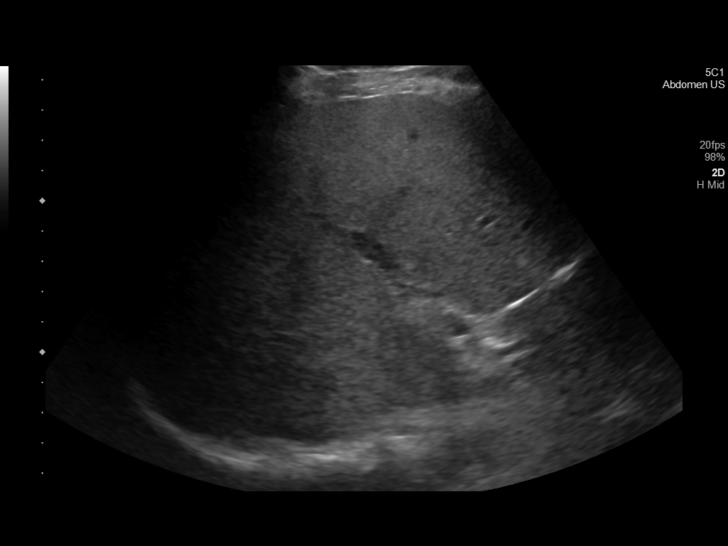
[im 26/61]
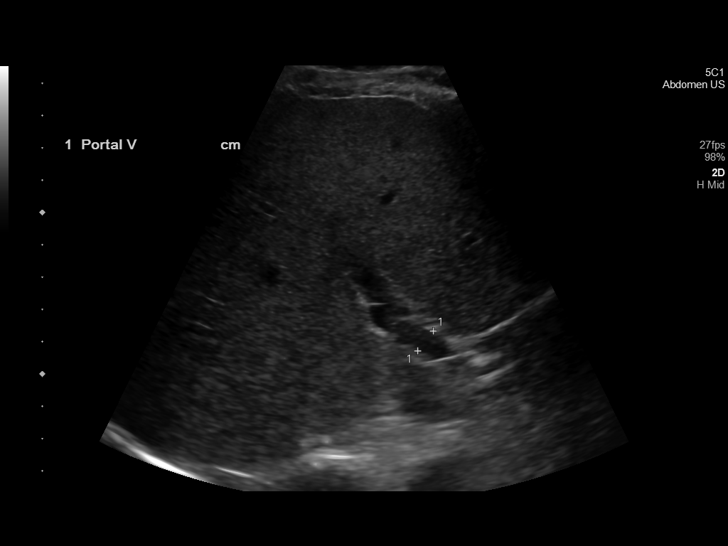
[im 31/61]
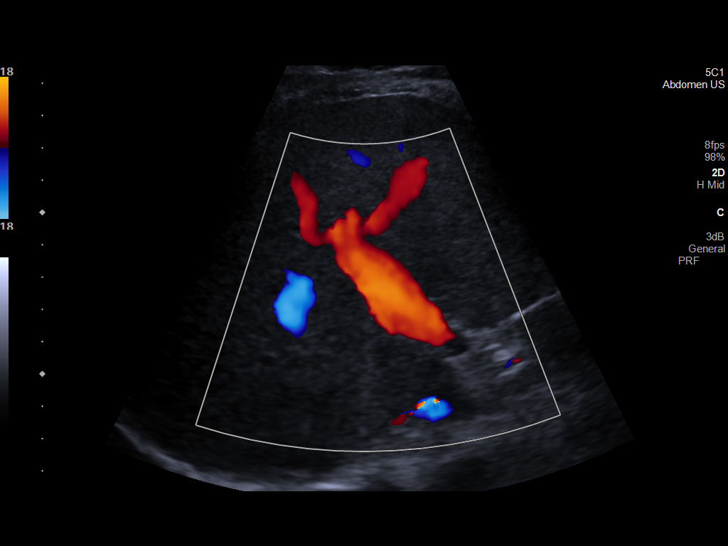
[im 36/61]
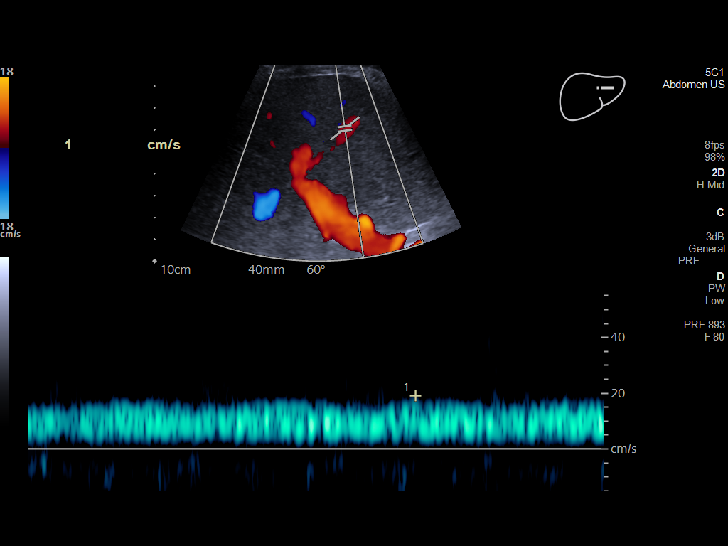
[im 41/61]
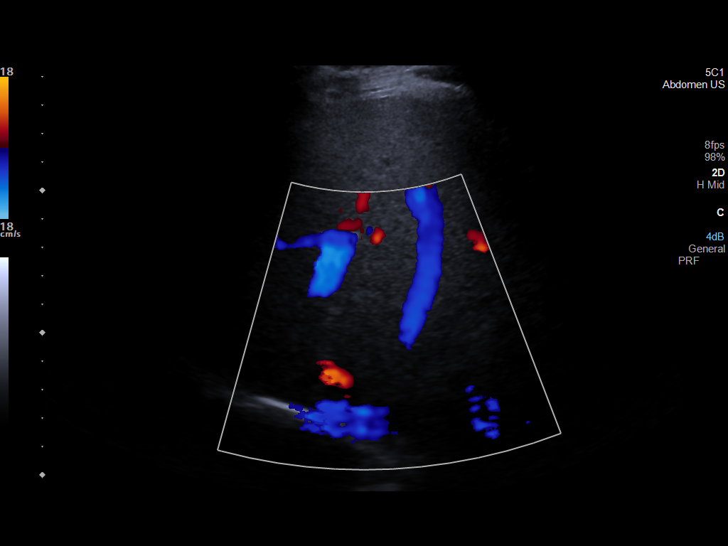
[im 46/61]
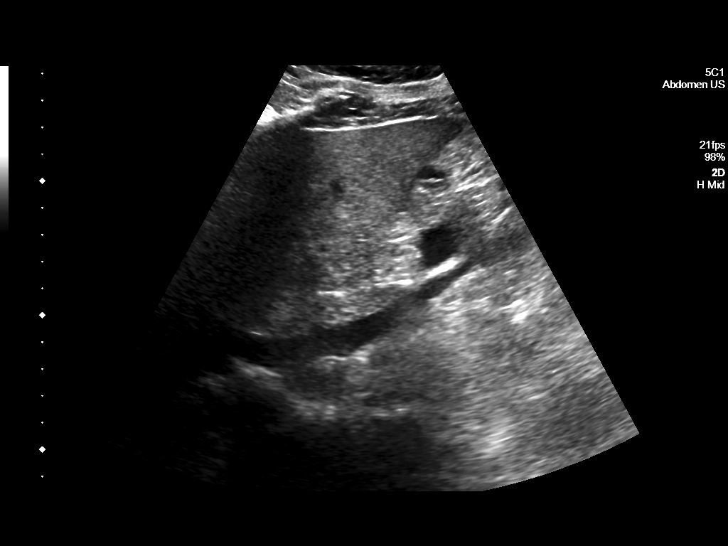
[im 51/61]
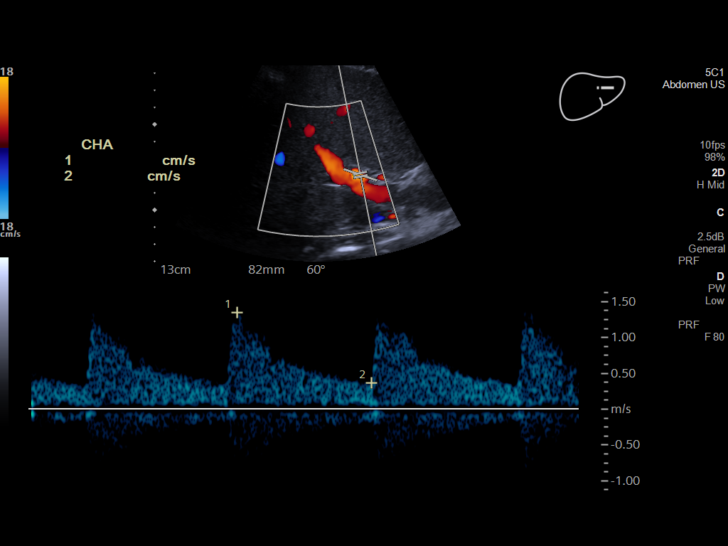
[im 56/61]
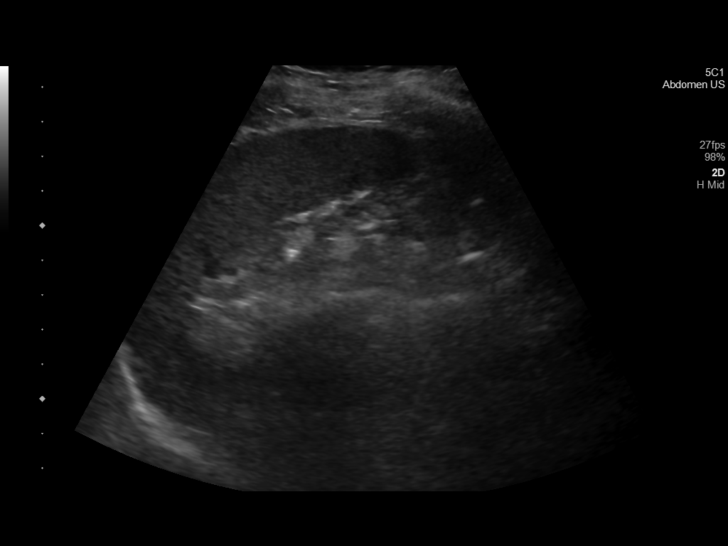
[im 61/61]
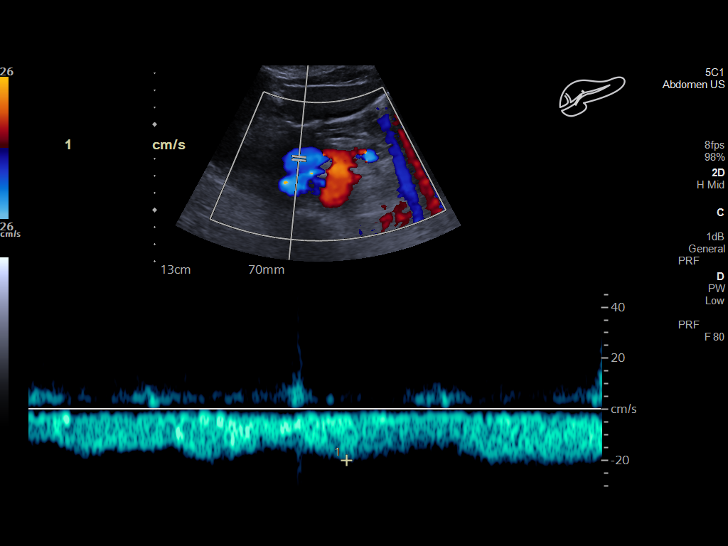

[13 of 25 positions shown; findings below may reference images not displayed]

FINDINGS: Liver: Increased echogenicity in the liver with poor visualization
of the internal architecture. Findings are suggestive for steatosis.
No intrahepatic biliary dilatation. Normal hepatic contour without
nodularity. No focal liver lesion.

Main Portal Vein size: 0.8 cm

Portal Vein Velocities

Main Prox:  30 cm/sec

Main Mid: 34 cm/sec

Main Dist:  30 cm/sec
Right: 22 cm/sec
Left: 19 cm/sec

Hepatic Vein Velocities

Right:  31 cm/sec

Middle:  30 cm/sec

Left:  24 cm/sec

IVC: Present and patent with normal respiratory phasicity.

Hepatic Artery Velocity:  135 cm/sec

Splenic Vein Velocity:  11 cm/sec

Spleen: 9.3 cm x 10.3 cm x 3.5 cm with a total volume of 168 cm^3
(411 cm^3 is upper limit normal)

Portal Vein Occlusion/Thrombus: No

Splenic Vein Occlusion/Thrombus: No

Ascites: None

Varices: None

Normal hepatopetal flow in the portal veins. Normal hepatofugal flow
in the hepatic veins.
IMPRESSION: 1. Portal venous system is patent with normal direction of flow.
2. Increased echogenicity in the hepatic parenchyma. Findings are
suggestive for steatosis.

## 2023-03-05 LAB — GENECONNECT MOLECULAR SCREEN: Genetic Analysis Overall Interpretation: NEGATIVE

## 2023-03-05 IMAGING — NM NM HEPATOBILIARY IMAGE, INC GB
1 series · 6 of 6 positions shown · non-contrast
Comparison: Recent ultrasounds

CLINICAL DATA: 30-year-old female with abdominal pain and elevated
bilirubin.

EXAM:
NUCLEAR MEDICINE HEPATOBILIARY IMAGING
TECHNIQUE: Sequential images of the abdomen were obtained [DATE] minutes
following intravenous administration of radiopharmaceutical.
RADIOPHARMACEUTICALS:  7.8 mCi Sc-SSm  Choletec IV

[Series 1000: hepatobiliary scan · 9.59mm/px · 6 of 60 frames shown]
[frame 6/60]
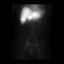
[frame 16/60]
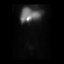
[frame 26/60]
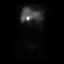
[frame 36/60]
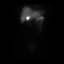
[frame 46/60]
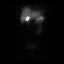
[frame 56/60]
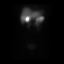

[6 of 6 positions shown; findings below may reference images not displayed]

FINDINGS: Prompt uptake and biliary excretion of activity by the liver is
seen. Gallbladder activity is visualized, consistent with patency of
cystic duct. Biliary activity passes into small bowel, consistent
with patent common bile duct.
IMPRESSION: Unremarkable exam. No evidence of cystic duct obstruction/acute
cholecystitis.

## 2023-07-03 ENCOUNTER — Ambulatory Visit (INDEPENDENT_AMBULATORY_CARE_PROVIDER_SITE_OTHER): Payer: MEDICAID | Admitting: Obstetrics

## 2023-07-03 ENCOUNTER — Encounter: Payer: Self-pay | Admitting: Obstetrics

## 2023-07-03 ENCOUNTER — Other Ambulatory Visit (HOSPITAL_COMMUNITY)
Admission: RE | Admit: 2023-07-03 | Discharge: 2023-07-03 | Disposition: A | Discharge status: Home / Self Care | Payer: Self-pay | Source: Ambulatory Visit | Attending: Obstetrics | Admitting: Obstetrics

## 2023-07-03 VITALS — BP 150/100 | HR 134 | Ht 60.0 in | Wt 144.2 lb

## 2023-07-03 DIAGNOSIS — L989 Disorder of the skin and subcutaneous tissue, unspecified: Secondary | ICD-10-CM

## 2023-07-03 DIAGNOSIS — R399 Unspecified symptoms and signs involving the genitourinary system: Secondary | ICD-10-CM | POA: Insufficient documentation

## 2023-07-03 DIAGNOSIS — Z3200 Encounter for pregnancy test, result unknown: Secondary | ICD-10-CM

## 2023-07-03 DIAGNOSIS — Z3202 Encounter for pregnancy test, result negative: Secondary | ICD-10-CM

## 2023-07-03 DIAGNOSIS — R000 Tachycardia, unspecified: Secondary | ICD-10-CM

## 2023-07-03 DIAGNOSIS — N949 Unspecified condition associated with female genital organs and menstrual cycle: Secondary | ICD-10-CM

## 2023-07-03 DIAGNOSIS — N926 Irregular menstruation, unspecified: Secondary | ICD-10-CM

## 2023-07-03 LAB — POCT URINE PREGNANCY: Preg Test, Ur: NEGATIVE

## 2023-07-03 LAB — POCT URINALYSIS DIPSTICK
Blood, UA: NEGATIVE
Glucose, UA: NEGATIVE
Nitrite, UA: NEGATIVE
Protein, UA: POSITIVE — AB
Spec Grav, UA: 1.02 (ref 1.010–1.025)
Urobilinogen, UA: 0.2 U/dL
pH, UA: 6 (ref 5.0–8.0)

## 2023-07-03 MED ORDER — ONDANSETRON 4 MG PO TBDP: 4.0000 mg | ORAL_TABLET | Freq: Three times a day (TID) | ORAL | 0 refills | Status: AC | PRN
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
## 2023-07-04 ENCOUNTER — Other Ambulatory Visit: Payer: Self-pay

## 2023-07-04 DIAGNOSIS — R000 Tachycardia, unspecified: Secondary | ICD-10-CM

## 2023-07-04 DIAGNOSIS — N926 Irregular menstruation, unspecified: Secondary | ICD-10-CM

## 2023-07-05 LAB — COMPREHENSIVE METABOLIC PANEL WITH GFR
ALT: 13 IU/L (ref 0–32)
AST: 19 IU/L (ref 0–40)
Albumin: 4.8 g/dL (ref 3.9–4.9)
Alkaline Phosphatase: 60 IU/L (ref 44–121)
BUN/Creatinine Ratio: 12 (ref 9–23)
BUN: 11 mg/dL (ref 6–20)
Bilirubin Total: 0.5 mg/dL (ref 0.0–1.2)
CO2: 18 mmol/L — ABNORMAL LOW (ref 20–29)
Calcium: 9.9 mg/dL (ref 8.7–10.2)
Chloride: 104 mmol/L (ref 96–106)
Creatinine, Ser: 0.89 mg/dL (ref 0.57–1.00)
Globulin, Total: 2 g/dL (ref 1.5–4.5)
Glucose: 82 mg/dL (ref 70–99)
Potassium: 4.5 mmol/L (ref 3.5–5.2)
Sodium: 137 mmol/L (ref 134–144)
Total Protein: 6.8 g/dL (ref 6.0–8.5)
eGFR: 87 mL/min/{1.73_m2} (ref >59)

## 2023-07-05 LAB — CBC WITH DIFF/PLATELET
Basophils Absolute: 0.1 10*3/uL (ref 0.0–0.2)
Basos: 1 %
EOS (ABSOLUTE): 0.2 10*3/uL (ref 0.0–0.4)
Eos: 2 %
Hematocrit: 41.4 % (ref 34.0–46.6)
Hemoglobin: 13.7 g/dL (ref 11.1–15.9)
Immature Grans (Abs): 0 10*3/uL (ref 0.0–0.1)
Immature Granulocytes: 0 %
Lymphocytes Absolute: 3.3 10*3/uL — ABNORMAL HIGH (ref 0.7–3.1)
Lymphs: 32 %
MCH: 28.5 pg (ref 26.6–33.0)
MCHC: 33.1 g/dL (ref 31.5–35.7)
MCV: 86 fL (ref 79–97)
Monocytes Absolute: 0.6 10*3/uL (ref 0.1–0.9)
Monocytes: 6 %
Neutrophils Absolute: 6.2 10*3/uL (ref 1.4–7.0)
Neutrophils: 59 %
Platelets: 399 10*3/uL (ref 150–450)
RBC: 4.81 x10E6/uL (ref 3.77–5.28)
RDW: 12.4 % (ref 11.7–15.4)
WBC: 10.4 10*3/uL (ref 3.4–10.8)

## 2023-07-05 LAB — CERVICOVAGINAL ANCILLARY ONLY
Bacterial Vaginitis (gardnerella): NEGATIVE
Candida Glabrata: NEGATIVE
Candida Vaginitis: NEGATIVE
Comment: NEGATIVE
Comment: NEGATIVE
Comment: NEGATIVE

## 2023-07-05 LAB — VITAMIN B12: Vitamin B-12: 603 pg/mL (ref 232–1245)

## 2023-07-05 LAB — THYROID ANTIBODIES (THYROPEROXIDASE & THYROGLOBULIN)
Thyroglobulin Antibody: <1 [IU]/mL (ref 0.0–0.9)
Thyroperoxidase Ab SerPl-aCnc: 20 [IU]/mL (ref 0–34)

## 2023-07-05 LAB — HEMOGLOBIN A1C
Est. average glucose Bld gHb Est-mCnc: 103 mg/dL
Hgb A1c MFr Bld: 5.2 % (ref 4.8–5.6)

## 2023-07-05 LAB — HUMAN CHORIONIC GONADOTROPIN(HCG),B-SUBUNIT,QUANTITATIVE): HCG, Beta Chain, Quant, S: <1 m[IU]/mL

## 2023-07-05 LAB — TSH+FREE T4
Free T4: 1.29 ng/dL (ref 0.82–1.77)
TSH: 1.69 u[IU]/mL (ref 0.450–4.500)

## 2023-07-05 LAB — VITAMIN D 25 HYDROXY (VIT D DEFICIENCY, FRACTURES): Vit D, 25-Hydroxy: 29.9 ng/mL — ABNORMAL LOW (ref 30.0–100.0)

## 2023-07-06 ENCOUNTER — Other Ambulatory Visit: Payer: Self-pay | Admitting: Obstetrics

## 2023-07-06 ENCOUNTER — Ambulatory Visit: Payer: Self-pay | Admitting: Obstetrics

## 2023-07-06 MED ORDER — MEDROXYPROGESTERONE ACETATE 10 MG PO TABS: 10.0000 mg | ORAL_TABLET | Freq: Every day | ORAL | 0 refills | Status: AC
